# Patient Record
Sex: Female | Born: 1935
Health system: Southern US, Community
[De-identification: ages and names within clinical notes are randomized; demographics above are authoritative.]

## PROBLEM LIST (undated history)

## (undated) DIAGNOSIS — N183 Chronic kidney disease, stage 3 unspecified: Secondary | ICD-10-CM

## (undated) DIAGNOSIS — M545 Low back pain, unspecified: Secondary | ICD-10-CM

## (undated) DIAGNOSIS — E559 Vitamin D deficiency, unspecified: Secondary | ICD-10-CM

## (undated) DIAGNOSIS — I2510 Atherosclerotic heart disease of native coronary artery without angina pectoris: Secondary | ICD-10-CM

## (undated) DIAGNOSIS — I6529 Occlusion and stenosis of unspecified carotid artery: Secondary | ICD-10-CM

## (undated) DIAGNOSIS — N3281 Overactive bladder: Secondary | ICD-10-CM

## (undated) DIAGNOSIS — E782 Mixed hyperlipidemia: Secondary | ICD-10-CM

## (undated) DIAGNOSIS — I10 Essential (primary) hypertension: Secondary | ICD-10-CM

## (undated) DIAGNOSIS — M8580 Other specified disorders of bone density and structure, unspecified site: Secondary | ICD-10-CM

## (undated) HISTORY — DX: Chronic kidney disease, stage 3 unspecified: N18.30

## (undated) HISTORY — DX: Vitamin D deficiency, unspecified: E55.9

## (undated) HISTORY — DX: Other specified disorders of bone density and structure, unspecified site: M85.80

## (undated) HISTORY — DX: Chronic kidney disease, stage 3 (moderate): N18.3

## (undated) HISTORY — DX: Essential (primary) hypertension: I10

## (undated) HISTORY — DX: Atherosclerotic heart disease of native coronary artery without angina pectoris: I25.10

## (undated) HISTORY — DX: Overactive bladder: N32.81

## (undated) HISTORY — DX: Low back pain: M54.5

## (undated) HISTORY — DX: Occlusion and stenosis of unspecified carotid artery: I65.29

## (undated) HISTORY — DX: Mixed hyperlipidemia: E78.2

## (undated) HISTORY — DX: Low back pain, unspecified: M54.50

---

## 1974-08-14 HISTORY — PX: AUGMENTATION MAMMAPLASTY: SUR837

## 1999-07-29 ENCOUNTER — Encounter: Admission: RE | Admit: 1999-07-29 | Discharge: 1999-07-29 | Payer: Self-pay | Admitting: Internal Medicine

## 1999-07-29 ENCOUNTER — Encounter: Payer: Self-pay | Admitting: Internal Medicine

## 1999-08-01 ENCOUNTER — Ambulatory Visit (HOSPITAL_COMMUNITY): Admission: RE | Admit: 1999-08-01 | Discharge: 1999-08-02 | Payer: Self-pay | Admitting: *Deleted

## 2000-08-27 ENCOUNTER — Encounter: Payer: Self-pay | Admitting: Internal Medicine

## 2000-08-27 ENCOUNTER — Encounter: Admission: RE | Admit: 2000-08-27 | Discharge: 2000-08-27 | Payer: Self-pay | Admitting: Internal Medicine

## 2000-09-04 ENCOUNTER — Encounter: Payer: Self-pay | Admitting: Internal Medicine

## 2000-09-04 ENCOUNTER — Encounter: Admission: RE | Admit: 2000-09-04 | Discharge: 2000-09-04 | Payer: Self-pay | Admitting: Internal Medicine

## 2002-08-21 ENCOUNTER — Other Ambulatory Visit: Admission: RE | Admit: 2002-08-21 | Discharge: 2002-08-21 | Payer: Self-pay | Admitting: *Deleted

## 2003-07-13 ENCOUNTER — Other Ambulatory Visit: Admission: RE | Admit: 2003-07-13 | Discharge: 2003-07-13 | Payer: Self-pay | Admitting: Internal Medicine

## 2004-01-01 ENCOUNTER — Ambulatory Visit (HOSPITAL_COMMUNITY): Admission: RE | Admit: 2004-01-01 | Discharge: 2004-01-01 | Payer: Self-pay | Admitting: Gastroenterology

## 2004-08-26 ENCOUNTER — Encounter: Admission: RE | Admit: 2004-08-26 | Discharge: 2004-08-26 | Payer: Self-pay | Admitting: Internal Medicine

## 2004-08-29 ENCOUNTER — Other Ambulatory Visit: Admission: RE | Admit: 2004-08-29 | Discharge: 2004-08-29 | Payer: Self-pay | Admitting: Internal Medicine

## 2005-08-22 ENCOUNTER — Other Ambulatory Visit: Admission: RE | Admit: 2005-08-22 | Discharge: 2005-08-22 | Payer: Self-pay | Admitting: Internal Medicine

## 2005-08-25 ENCOUNTER — Encounter: Admission: RE | Admit: 2005-08-25 | Discharge: 2005-08-25 | Payer: Self-pay | Admitting: Internal Medicine

## 2005-10-23 ENCOUNTER — Encounter: Admission: RE | Admit: 2005-10-23 | Discharge: 2005-10-23 | Payer: Self-pay | Admitting: Internal Medicine

## 2006-10-30 ENCOUNTER — Encounter: Admission: RE | Admit: 2006-10-30 | Discharge: 2006-10-30 | Payer: Self-pay | Admitting: Internal Medicine

## 2006-11-16 ENCOUNTER — Encounter: Admission: RE | Admit: 2006-11-16 | Discharge: 2006-11-16 | Payer: Self-pay | Admitting: Interventional Cardiology

## 2006-11-29 ENCOUNTER — Emergency Department (HOSPITAL_COMMUNITY): Admission: EM | Admit: 2006-11-29 | Discharge: 2006-11-29 | Payer: Self-pay | Admitting: Emergency Medicine

## 2006-12-04 ENCOUNTER — Ambulatory Visit: Payer: Self-pay | Admitting: Vascular Surgery

## 2008-01-01 ENCOUNTER — Encounter: Admission: RE | Admit: 2008-01-01 | Discharge: 2008-01-01 | Payer: Self-pay | Admitting: Internal Medicine

## 2009-03-02 ENCOUNTER — Encounter: Admission: RE | Admit: 2009-03-02 | Discharge: 2009-03-02 | Payer: Self-pay | Admitting: Internal Medicine

## 2010-03-09 ENCOUNTER — Encounter: Admission: RE | Admit: 2010-03-09 | Discharge: 2010-03-09 | Payer: Self-pay | Admitting: Internal Medicine

## 2011-12-19 ENCOUNTER — Ambulatory Visit: Payer: Medicare Other | Attending: Internal Medicine

## 2011-12-19 DIAGNOSIS — M545 Low back pain, unspecified: Secondary | ICD-10-CM | POA: Insufficient documentation

## 2011-12-19 DIAGNOSIS — IMO0001 Reserved for inherently not codable concepts without codable children: Secondary | ICD-10-CM | POA: Insufficient documentation

## 2011-12-19 DIAGNOSIS — M25569 Pain in unspecified knee: Secondary | ICD-10-CM | POA: Insufficient documentation

## 2011-12-26 ENCOUNTER — Ambulatory Visit: Payer: Medicare Other | Admitting: Physical Therapy

## 2011-12-28 ENCOUNTER — Ambulatory Visit: Payer: Medicare Other

## 2012-01-02 ENCOUNTER — Ambulatory Visit: Payer: Medicare Other

## 2012-01-05 ENCOUNTER — Ambulatory Visit: Payer: Medicare Other

## 2012-01-09 ENCOUNTER — Ambulatory Visit: Payer: Medicare Other | Admitting: Rehabilitative and Restorative Service Providers"

## 2012-01-12 ENCOUNTER — Ambulatory Visit: Payer: Medicare Other

## 2012-01-16 ENCOUNTER — Ambulatory Visit: Payer: Medicare Other | Attending: Internal Medicine

## 2012-01-16 DIAGNOSIS — M545 Low back pain, unspecified: Secondary | ICD-10-CM | POA: Insufficient documentation

## 2012-01-16 DIAGNOSIS — M25569 Pain in unspecified knee: Secondary | ICD-10-CM | POA: Insufficient documentation

## 2012-01-16 DIAGNOSIS — IMO0001 Reserved for inherently not codable concepts without codable children: Secondary | ICD-10-CM | POA: Insufficient documentation

## 2012-01-18 ENCOUNTER — Ambulatory Visit: Payer: Medicare Other | Admitting: Rehabilitative and Restorative Service Providers"

## 2012-01-25 ENCOUNTER — Ambulatory Visit: Payer: Medicare Other | Admitting: Rehabilitative and Restorative Service Providers"

## 2012-01-30 ENCOUNTER — Ambulatory Visit: Payer: Medicare Other | Admitting: Rehabilitative and Restorative Service Providers"

## 2012-02-06 ENCOUNTER — Encounter: Payer: Medicare Other | Admitting: Physical Therapy

## 2012-02-14 ENCOUNTER — Encounter: Payer: Medicare Other | Admitting: Rehabilitative and Restorative Service Providers"

## 2012-11-19 ENCOUNTER — Other Ambulatory Visit: Payer: Self-pay

## 2012-11-19 DIAGNOSIS — Z9882 Breast implant status: Secondary | ICD-10-CM

## 2012-11-19 DIAGNOSIS — Z1231 Encounter for screening mammogram for malignant neoplasm of breast: Secondary | ICD-10-CM

## 2012-12-20 ENCOUNTER — Ambulatory Visit
Admission: RE | Admit: 2012-12-20 | Discharge: 2012-12-20 | Disposition: A | Discharge status: Home / Self Care | Payer: Medicare Other | Source: Ambulatory Visit

## 2012-12-20 DIAGNOSIS — Z9882 Breast implant status: Secondary | ICD-10-CM

## 2012-12-20 DIAGNOSIS — Z1231 Encounter for screening mammogram for malignant neoplasm of breast: Secondary | ICD-10-CM

## 2013-05-16 ENCOUNTER — Telehealth: Payer: Self-pay | Admitting: Interventional Cardiology

## 2013-05-16 NOTE — Telephone Encounter (Signed)
New Problem  Samples.. Sent to medications line.

## 2013-05-19 ENCOUNTER — Telehealth: Payer: Self-pay | Admitting: *Deleted

## 2013-05-19 NOTE — Telephone Encounter (Signed)
Pt. called requesting Zetia samples. If samples are not available, she would like a refill sent to [facility] on [address]. Thanks, MI

## 2013-05-19 NOTE — Telephone Encounter (Signed)
Patient notified that samples of Zetia left at front desk for pick up.

## 2013-05-19 NOTE — Telephone Encounter (Signed)
New Problem  Pt request Zetia samples// please cal back.

## 2013-07-07 ENCOUNTER — Other Ambulatory Visit: Payer: Self-pay | Admitting: Interventional Cardiology

## 2013-07-24 ENCOUNTER — Telehealth: Payer: Self-pay | Admitting: Interventional Cardiology

## 2013-07-24 MED ORDER — EZETIMIBE 10 MG PO TABS: 10.0000 mg | ORAL_TABLET | Freq: Every day | ORAL | Status: DC

## 2013-07-24 NOTE — Telephone Encounter (Signed)
New message    Want samples of zetia---if we do not have samples, pls call in presc to [facility].  Pt has not seen Dr Seth Bake in new office

## 2013-07-24 NOTE — Telephone Encounter (Signed)
Refilled zetia as we are out of samples.

## 2013-08-04 ENCOUNTER — Other Ambulatory Visit: Payer: Self-pay | Admitting: Interventional Cardiology

## 2013-09-02 ENCOUNTER — Telehealth: Payer: Self-pay | Admitting: Interventional Cardiology

## 2013-09-02 NOTE — Telephone Encounter (Signed)
Ok for [employer]. See last telephone note.

## 2013-09-02 NOTE — Telephone Encounter (Signed)
New message     Want samples of zetia---do we have samples?   Pt has not seen Dr Irish Lack in the new office.

## 2013-09-04 NOTE — Telephone Encounter (Signed)
Samples left at front desk for pick up.  

## 2014-02-24 ENCOUNTER — Encounter: Payer: Self-pay | Admitting: Interventional Cardiology

## 2014-03-05 ENCOUNTER — Other Ambulatory Visit: Payer: Self-pay | Admitting: Cardiology

## 2014-03-05 DIAGNOSIS — I6529 Occlusion and stenosis of unspecified carotid artery: Secondary | ICD-10-CM

## 2014-04-10 ENCOUNTER — Ambulatory Visit: Payer: Medicare Other | Admitting: Interventional Cardiology

## 2014-04-14 ENCOUNTER — Other Ambulatory Visit: Payer: Self-pay

## 2014-04-14 ENCOUNTER — Telehealth: Payer: Self-pay | Admitting: Interventional Cardiology

## 2014-04-14 MED ORDER — AMLODIPINE BESYLATE 5 MG PO TABS: ORAL_TABLET | ORAL | Status: DC

## 2014-04-14 NOTE — Telephone Encounter (Signed)
New message     Refill atenolol and hctz---have not seen Dr Irish Lack at new office.  [email or web address]

## 2014-04-15 MED ORDER — ATENOLOL 100 MG PO TABS: 100.0000 mg | ORAL_TABLET | Freq: Every day | ORAL | Status: DC

## 2014-04-15 MED ORDER — HYDROCHLOROTHIAZIDE 25 MG PO TABS: 25.0000 mg | ORAL_TABLET | Freq: Every day | ORAL | Status: DC

## 2014-04-15 NOTE — Telephone Encounter (Signed)
Refilled

## 2014-05-04 ENCOUNTER — Encounter: Payer: Self-pay | Admitting: Cardiology

## 2014-05-04 DIAGNOSIS — I2510 Atherosclerotic heart disease of native coronary artery without angina pectoris: Secondary | ICD-10-CM | POA: Insufficient documentation

## 2014-05-04 DIAGNOSIS — I6529 Occlusion and stenosis of unspecified carotid artery: Secondary | ICD-10-CM

## 2014-05-04 DIAGNOSIS — I779 Disorder of arteries and arterioles, unspecified: Secondary | ICD-10-CM | POA: Insufficient documentation

## 2014-05-04 DIAGNOSIS — E782 Mixed hyperlipidemia: Secondary | ICD-10-CM

## 2014-05-04 DIAGNOSIS — I739 Peripheral vascular disease, unspecified: Secondary | ICD-10-CM

## 2014-05-04 DIAGNOSIS — I10 Essential (primary) hypertension: Secondary | ICD-10-CM

## 2014-05-05 ENCOUNTER — Encounter (HOSPITAL_COMMUNITY): Payer: Medicare Other

## 2014-05-05 ENCOUNTER — Encounter: Payer: Self-pay | Admitting: Interventional Cardiology

## 2014-05-05 ENCOUNTER — Encounter: Payer: Self-pay | Admitting: Cardiology

## 2014-05-05 ENCOUNTER — Ambulatory Visit (INDEPENDENT_AMBULATORY_CARE_PROVIDER_SITE_OTHER): Payer: Commercial Managed Care - HMO | Admitting: Interventional Cardiology

## 2014-05-05 ENCOUNTER — Ambulatory Visit (HOSPITAL_COMMUNITY): Payer: Medicare HMO | Attending: Interventional Cardiology | Admitting: Cardiology

## 2014-05-05 VITALS — BP 110/70 | HR 51 | Ht 63.5 in | Wt 189.0 lb

## 2014-05-05 DIAGNOSIS — Z7901 Long term (current) use of anticoagulants: Secondary | ICD-10-CM | POA: Insufficient documentation

## 2014-05-05 DIAGNOSIS — Z7982 Long term (current) use of aspirin: Secondary | ICD-10-CM | POA: Insufficient documentation

## 2014-05-05 DIAGNOSIS — I6529 Occlusion and stenosis of unspecified carotid artery: Secondary | ICD-10-CM

## 2014-05-05 DIAGNOSIS — E785 Hyperlipidemia, unspecified: Secondary | ICD-10-CM | POA: Diagnosis not present

## 2014-05-05 DIAGNOSIS — I498 Other specified cardiac arrhythmias: Secondary | ICD-10-CM | POA: Insufficient documentation

## 2014-05-05 DIAGNOSIS — I129 Hypertensive chronic kidney disease with stage 1 through stage 4 chronic kidney disease, or unspecified chronic kidney disease: Secondary | ICD-10-CM | POA: Insufficient documentation

## 2014-05-05 DIAGNOSIS — N183 Chronic kidney disease, stage 3 unspecified: Secondary | ICD-10-CM | POA: Diagnosis not present

## 2014-05-05 DIAGNOSIS — E782 Mixed hyperlipidemia: Secondary | ICD-10-CM

## 2014-05-05 DIAGNOSIS — I2510 Atherosclerotic heart disease of native coronary artery without angina pectoris: Secondary | ICD-10-CM | POA: Insufficient documentation

## 2014-05-05 DIAGNOSIS — Z8249 Family history of ischemic heart disease and other diseases of the circulatory system: Secondary | ICD-10-CM | POA: Insufficient documentation

## 2014-05-05 DIAGNOSIS — E7800 Pure hypercholesterolemia, unspecified: Secondary | ICD-10-CM | POA: Insufficient documentation

## 2014-05-05 DIAGNOSIS — I10 Essential (primary) hypertension: Secondary | ICD-10-CM

## 2014-05-05 MED ORDER — EZETIMIBE 10 MG PO TABS: 10.0000 mg | ORAL_TABLET | Freq: Every day | ORAL | Status: DC

## 2014-05-05 NOTE — Patient Instructions (Addendum)
Your physician has recommended you make the following change in your medication:   1. Restart Zetia 10 mg 1 tablet daily.  Your physician recommends that you return for a FASTING lipid and heaptic panel on 08/04/14.  Your physician wants you to follow-up in: 1 year with Dr. Irish Lack. You will receive a reminder letter in the mail two months in advance. If you don't receive a letter, please call our office to schedule the follow-up appointment.

## 2014-05-05 NOTE — Progress Notes (Signed)
Patient ID: Lisa Petty, female   DOB: [birth year], 78 y.o.   MRN: [id number]    [place], [place], [place]  [address] Phone: [phone] Fax:  [phone]  Date:  05/05/2014   ID:  Lisa, Petty Jul 05, 1936, MRN [id number]  PCP:  Horton Finer, MD      History of Present Illness: Lisa Petty is a 78 y.o. female who has an occluded carotid artery. She has been doing well. SHe has not been having any neuro sx. Exercise limited by back and knee pain. She has not been exercising at the [facility]. BP has not been checked at the drug store. Son had heart transplant, almost 2 years ago. CAD/ASCVD:  c/o Dyspnea on exertion not as severe as she had prior to stent; minimal.  c/o Leg edema more at the end of the day.  Denies : Chest pain.  Diaphoresis.  Dizziness.  Nitroglycerin.  Orthopnea.  Palpitations.  Paroxysmal nocturnal dyspnea.  Syncope.     Wt Readings from Last 3 Encounters:  05/05/14 189 lb (85.73 kg)     Past Medical History  Diagnosis Date  . Coronary atherosclerosis of native coronary artery     s/p stent to LAD 2000-no MI, right ICA 100% stenosis;left side followed by Dr. Irish Lack  . Essential hypertension, benign   . Occlusion and stenosis of carotid artery without mention of cerebral infarction   . Mixed hyperlipidemia   . Osteopenia     mild to normal (BMD 08/18/09 normal)  . CKD (chronic kidney disease), stage III   . Low back pain   . Overactive bladder     on detrol   . Vitamin D deficiency     repleted by ASCUS    Current Outpatient Prescriptions  Medication Sig Dispense Refill  . amLODipine (NORVASC) 5 MG tablet take 1 tablet by mouth once daily for blood pressure  30 tablet  1  . aspirin 81 MG tablet Take 81 mg by mouth daily.      Marland Kitchen atenolol (TENORMIN) 100 MG tablet Take 1 tablet (100 mg total) by mouth daily.  30 tablet  0  . Calcium Carbonate-Vitamin D (RA CALCIUM PLUS VITAMIN D) 600-400 MG-UNIT per tablet Take 1 tablet  by mouth 2 (two) times daily.      . clopidogrel (PLAVIX) 75 MG tablet take 1 tablet by mouth once daily  30 tablet  10  . diltiazem (CARDIZEM CD) 240 MG 24 hr capsule Take 240 mg by mouth daily.      Marland Kitchen diltiazem (DILACOR XR) 240 MG 24 hr capsule Take 240 mg by mouth daily.      . DUREZOL 0.05 % EMUL Place into both eyes daily.      Marland Kitchen gentamicin (GARAMYCIN) 0.3 % ophthalmic solution       . hydrochlorothiazide (HYDRODIURIL) 25 MG tablet Take 1 tablet (25 mg total) by mouth daily.  30 tablet  0  . ILEVRO 0.3 % SUSP       . LORazepam (ATIVAN) 0.5 MG tablet Take 0.5 mg by mouth 2 (two) times daily as needed for anxiety.      Marland Kitchen losartan (COZAAR) 50 MG tablet Take 50 mg by mouth daily.      . nitroGLYCERIN (NITROSTAT) 0.4 MG SL tablet Place 0.4 mg under the tongue every 5 (five) minutes as needed for chest pain.      Marland Kitchen PROLENSA 0.07 % SOLN       .  rosuvastatin (CRESTOR) 40 MG tablet Take 40 mg by mouth daily.      Marland Kitchen tolterodine (DETROL LA) 4 MG 24 hr capsule Take 4 mg by mouth daily.      . Vitamin D, Ergocalciferol, (DRISDOL) 50000 UNITS CAPS capsule Take 50,000 Units by mouth daily.      . vitamin E 400 UNIT capsule Take 400 Units by mouth daily.      Marland Kitchen ezetimibe (ZETIA) 10 MG tablet Take 1 tablet (10 mg total) by mouth daily.  30 tablet  5   No current facility-administered medications for this visit.    Allergies:   No Known Allergies  Social History:  The patient  reports that she has never smoked. She does not have any smokeless tobacco history on file. She reports that she does not drink alcohol.   Family History:  The patient's family history includes CAD in her brother, father, and son; Dementia in her mother; Diabetes in her sister; Lung cancer in her father.   ROS:  Please see the history of present illness.  No nausea, vomiting.  No fevers, chills.  No focal weakness.  No dysuria. Knee pain.   All other systems reviewed and negative.   PHYSICAL EXAM: VS:  BP 110/70  Pulse 51   Ht 5' 3.5" (1.613 m)  Wt 189 lb (85.73 kg)  BMI 32.95 kg/m2 Well nourished, well developed, in no acute distress HEENT: normal Neck: no JVD, Cardiac:  normal S1, S2; RRR;  Lungs:  clear to auscultation bilaterally, no wheezing, rhonchi or rales Abd: soft, nontender, no hepatomegaly Ext: no edema Skin: warm and dry Neuro:   no focal abnormalities noted  EKG:  Sinus bradycardia, septal Q waves   ASSESSMENT AND PLAN:  Coronary atherosclerosis of native coronary artery  Continue Plavix Tablet, 75 MG, take 1 tablet by mouth once daily Continue Aspirin Tablet Chewable, 81 MG, 1 tablet, Orally, Once a day IMAGING: EKG    Overton,Shana 04/11/2013 10:42:36 AM > Shonte Beutler,JAY 04/11/2013 11:04:03 AM > NSR, IRBBB, no significant ST segment changes   Notes: No angina. Negative stress test in 8/11. Prior LAD stent   2. Essential hypertension, benign  Notes: Check BP outside of the doctor's office. If consistently > 140/90, then let us know and would increase BP meds.  Continue Diltiazem and amlodipine since  Cannot increase dilt due to sloe heart rate.   Better today. Minimize salt.   Would try for target < 140/90 due to known CAD/vascular disease   3. Carotid Artery Disease  Notes: Continue regular Duplex checks. Known occluded right ICA.    4. Hypercholesterolemia, Mixed  Restart Zetia Tablet, 10 MG, take 1 tablet by mouth once daily, Orally, Once a day Continue Crestor Tablet, 40 MG, 1 tablet, Orally, Once a day Notes: LDL 79 in 2014. Agree with crestor. LDL 114 off of Zetia.  Restart Zetia for target LDL < 100.  Lipids in 3 months.    Preventive Medicine  Adult topics discussed:  Diet: healthy diet, low calorie, low fat, low salt.  Exercise: 5 days a week, at least 30 minutes of aerobic exercise.      Signed, Mina Marble, MD, [facility] 05/05/2014 11:46 AM

## 2014-05-05 NOTE — Progress Notes (Signed)
Carotid duplex performed 

## 2014-05-06 ENCOUNTER — Other Ambulatory Visit: Payer: Self-pay | Admitting: Cardiology

## 2014-05-06 DIAGNOSIS — I6529 Occlusion and stenosis of unspecified carotid artery: Secondary | ICD-10-CM

## 2014-05-15 ENCOUNTER — Other Ambulatory Visit: Payer: Self-pay | Admitting: *Deleted

## 2014-05-15 MED ORDER — HYDROCHLOROTHIAZIDE 25 MG PO TABS: 25.0000 mg | ORAL_TABLET | Freq: Every day | ORAL | Status: DC

## 2014-05-15 MED ORDER — ATENOLOL 100 MG PO TABS: 100.0000 mg | ORAL_TABLET | Freq: Every day | ORAL | Status: DC

## 2014-06-16 ENCOUNTER — Telehealth: Payer: Self-pay

## 2014-06-16 ENCOUNTER — Other Ambulatory Visit: Payer: Self-pay

## 2014-06-16 MED ORDER — AMLODIPINE BESYLATE 5 MG PO TABS: ORAL_TABLET | ORAL | Status: DC

## 2014-06-16 NOTE — Telephone Encounter (Signed)
Patient called to get samples of zetia placed a month samples at front desk

## 2014-07-21 ENCOUNTER — Other Ambulatory Visit: Payer: Self-pay | Admitting: *Deleted

## 2014-07-21 MED ORDER — CLOPIDOGREL BISULFATE 75 MG PO TABS: 75.0000 mg | ORAL_TABLET | Freq: Every day | ORAL | Status: DC

## 2014-07-29 ENCOUNTER — Telehealth: Payer: Self-pay | Admitting: Interventional Cardiology

## 2014-07-29 NOTE — Telephone Encounter (Signed)
Received request from Nurse fax box, documents faxed for surgical clearance. To: Lisa Petty  Fax number: [phone] Attention: [id number]/KM

## 2014-08-04 ENCOUNTER — Other Ambulatory Visit (INDEPENDENT_AMBULATORY_CARE_PROVIDER_SITE_OTHER): Payer: Commercial Managed Care - HMO | Admitting: *Deleted

## 2014-08-04 DIAGNOSIS — E782 Mixed hyperlipidemia: Secondary | ICD-10-CM

## 2014-08-04 LAB — HEPATIC FUNCTION PANEL
ALK PHOS: 61 U/L (ref 39–117)
ALT: 19 U/L (ref 0–35)
AST: 22 U/L (ref 0–37)
Albumin: 4.2 g/dL (ref 3.5–5.2)
BILIRUBIN TOTAL: 0.7 mg/dL (ref 0.2–1.2)
Bilirubin, Direct: 0 mg/dL (ref 0.0–0.3)
Total Protein: 7.5 g/dL (ref 6.0–8.3)

## 2014-08-04 LAB — LIPID PANEL
CHOLESTEROL: 143 mg/dL (ref 0–200)
HDL: 47.2 mg/dL (ref >39.00)
LDL Cholesterol: 72 mg/dL (ref 0–99)
NonHDL: 95.8
Total CHOL/HDL Ratio: 3
Triglycerides: 118 mg/dL (ref 0.0–149.0)
VLDL: 23.6 mg/dL (ref 0.0–40.0)

## 2014-09-02 ENCOUNTER — Telehealth: Payer: Self-pay | Admitting: *Deleted

## 2014-09-02 NOTE — Telephone Encounter (Signed)
Zetia samples placed at the front desk for patient. 

## 2014-11-13 ENCOUNTER — Telehealth: Payer: Self-pay

## 2014-11-13 NOTE — Telephone Encounter (Signed)
Patient called for samples of zetia placed samples up front 

## 2014-11-16 ENCOUNTER — Other Ambulatory Visit: Payer: Self-pay | Admitting: Internal Medicine

## 2014-11-16 DIAGNOSIS — Z1231 Encounter for screening mammogram for malignant neoplasm of breast: Secondary | ICD-10-CM

## 2015-01-15 ENCOUNTER — Ambulatory Visit
Admission: RE | Admit: 2015-01-15 | Discharge: 2015-01-15 | Disposition: A | Discharge status: Home / Self Care | Payer: Commercial Managed Care - HMO | Source: Ambulatory Visit | Attending: Internal Medicine | Admitting: Internal Medicine

## 2015-01-15 DIAGNOSIS — Z1231 Encounter for screening mammogram for malignant neoplasm of breast: Secondary | ICD-10-CM

## 2015-01-19 ENCOUNTER — Other Ambulatory Visit: Payer: Self-pay | Admitting: Internal Medicine

## 2015-01-19 DIAGNOSIS — R928 Other abnormal and inconclusive findings on diagnostic imaging of breast: Secondary | ICD-10-CM

## 2015-01-22 ENCOUNTER — Ambulatory Visit
Admission: RE | Admit: 2015-01-22 | Discharge: 2015-01-22 | Disposition: A | Discharge status: Home / Self Care | Payer: Commercial Managed Care - HMO | Source: Ambulatory Visit | Attending: Internal Medicine | Admitting: Internal Medicine

## 2015-01-22 DIAGNOSIS — R928 Other abnormal and inconclusive findings on diagnostic imaging of breast: Secondary | ICD-10-CM

## 2015-01-30 ENCOUNTER — Other Ambulatory Visit: Payer: Self-pay | Admitting: Interventional Cardiology

## 2015-05-07 ENCOUNTER — Encounter (HOSPITAL_COMMUNITY): Payer: Commercial Managed Care - HMO

## 2015-05-12 ENCOUNTER — Other Ambulatory Visit: Payer: Self-pay | Admitting: Interventional Cardiology

## 2015-05-12 DIAGNOSIS — I6523 Occlusion and stenosis of bilateral carotid arteries: Secondary | ICD-10-CM

## 2015-05-15 ENCOUNTER — Other Ambulatory Visit: Payer: Self-pay | Admitting: Interventional Cardiology

## 2015-05-18 ENCOUNTER — Ambulatory Visit (HOSPITAL_COMMUNITY)
Admission: RE | Admit: 2015-05-18 | Discharge: 2015-05-18 | Disposition: A | Discharge status: Home / Self Care | Payer: Commercial Managed Care - HMO | Source: Ambulatory Visit | Attending: Urology | Admitting: Urology

## 2015-05-18 DIAGNOSIS — N183 Chronic kidney disease, stage 3 (moderate): Secondary | ICD-10-CM | POA: Diagnosis not present

## 2015-05-18 DIAGNOSIS — I129 Hypertensive chronic kidney disease with stage 1 through stage 4 chronic kidney disease, or unspecified chronic kidney disease: Secondary | ICD-10-CM | POA: Insufficient documentation

## 2015-05-18 DIAGNOSIS — E782 Mixed hyperlipidemia: Secondary | ICD-10-CM | POA: Insufficient documentation

## 2015-05-18 DIAGNOSIS — I6523 Occlusion and stenosis of bilateral carotid arteries: Secondary | ICD-10-CM

## 2015-05-27 ENCOUNTER — Encounter: Payer: Self-pay | Admitting: Interventional Cardiology

## 2015-05-27 ENCOUNTER — Ambulatory Visit (INDEPENDENT_AMBULATORY_CARE_PROVIDER_SITE_OTHER): Payer: Commercial Managed Care - HMO | Admitting: Interventional Cardiology

## 2015-05-27 VITALS — BP 140/62 | HR 54 | Ht 63.5 in | Wt 191.4 lb

## 2015-05-27 DIAGNOSIS — I779 Disorder of arteries and arterioles, unspecified: Secondary | ICD-10-CM | POA: Diagnosis not present

## 2015-05-27 DIAGNOSIS — E782 Mixed hyperlipidemia: Secondary | ICD-10-CM | POA: Diagnosis not present

## 2015-05-27 DIAGNOSIS — I10 Essential (primary) hypertension: Secondary | ICD-10-CM | POA: Diagnosis not present

## 2015-05-27 DIAGNOSIS — I2510 Atherosclerotic heart disease of native coronary artery without angina pectoris: Secondary | ICD-10-CM | POA: Diagnosis not present

## 2015-05-27 DIAGNOSIS — I739 Peripheral vascular disease, unspecified: Secondary | ICD-10-CM

## 2015-05-27 NOTE — Progress Notes (Signed)
Patient ID: Lisa Petty, female   DOB: [birth year], 80 y.o.   MRN: [id number]     Cardiology Office Note   Date:  05/27/2015   ID:  Lisa Petty, DOB 05/05/1936, MRN [id number]  PCP:  Lottie Dawson, MD    Chief Complaint  Patient presents with  . Follow-up    ANNUAL  coronary, carotid artery disease   Wt Readings from Last 3 Encounters:  05/27/15 191 lb 6.4 oz (86.818 kg)  05/05/14 189 lb (85.73 kg)       History of Present Illness: Lisa Petty is a 79 y.o. female  who has an occluded carotid artery. She has been doing well. SHe has not been having any neuro sx. Exercise limited by back and knee pain. She has not been exercising at the [facility]. BP has not been checked at the drug store. Son had heart transplant, in 2013. CAD/ASCVD:  c/o Dyspnea on exertion not as severe as she had prior to stent; minimal.  c/o Leg edema more at the end of the day.  Denies : Chest pain.  Diaphoresis.  Dizziness.  Nitroglycerin.  Orthopnea.  Palpitations.  Paroxysmal nocturnal dyspnea.  Syncope.    Overall, she is feeling well.  SHe is following up with u/s for her carotid disease.   Past Medical History  Diagnosis Date  . Coronary atherosclerosis of native coronary artery     s/p stent to LAD 2000-no MI, right ICA 100% stenosis;left side followed by Dr. Irish Lack  . Essential hypertension, benign   . Occlusion and stenosis of carotid artery without mention of cerebral infarction   . Mixed hyperlipidemia   . Osteopenia     mild to normal (BMD 08/18/09 normal)  . CKD (chronic kidney disease), stage III   . Low back pain   . Overactive bladder     on detrol   . Vitamin D deficiency     repleted by ASCUS    No past surgical history on file.   Current Outpatient Prescriptions  Medication Sig Dispense Refill  . amLODipine (NORVASC) 5 MG tablet TAKE ONE TABLET BY MOUTH ONCE DAILY FOR BLOOD PRESSURE 30 tablet 3  . aspirin 81 MG tablet Take 81 mg by mouth daily.    Marland Kitchen  atenolol (TENORMIN) 100 MG tablet Take 1 tablet (100 mg total) by mouth daily. 30 tablet 11  . atorvastatin (LIPITOR) 80 MG tablet Take 80 mg by mouth at bedtime.    . Calcium Carbonate-Vitamin D (RA CALCIUM PLUS VITAMIN D) 600-400 MG-UNIT per tablet Take 1 tablet by mouth 2 (two) times daily.    . clopidogrel (PLAVIX) 75 MG tablet Take 1 tablet (75 mg total) by mouth daily. 30 tablet 10  . diltiazem (CARDIZEM CD) 240 MG 24 hr capsule Take 240 mg by mouth daily.    . DUREZOL 0.05 % EMUL Place into both eyes daily.    Marland Kitchen gentamicin (GARAMYCIN) 0.3 % ophthalmic solution     . hydrochlorothiazide (HYDRODIURIL) 25 MG tablet Take 1 tablet (25 mg total) by mouth daily. 30 tablet 11  . ILEVRO 0.3 % SUSP     . LORazepam (ATIVAN) 0.5 MG tablet Take 0.5 mg by mouth 2 (two) times daily as needed for anxiety.    Marland Kitchen losartan (COZAAR) 50 MG tablet Take 50 mg by mouth daily.    . nitroGLYCERIN (NITROSTAT) 0.4 MG SL tablet Place 0.4 mg under the tongue every 5 (five) minutes as needed for chest pain.    Marland Kitchen  PROLENSA 0.07 % SOLN     . tolterodine (DETROL LA) 4 MG 24 hr capsule Take 4 mg by mouth daily.    . traMADol (ULTRAM) 50 MG tablet Take 50 mg by mouth every 6 (six) hours as needed. (PAIN)    . Vitamin D, Ergocalciferol, (DRISDOL) 50000 UNITS CAPS capsule Take 50,000 Units by mouth daily.    . vitamin E 400 UNIT capsule Take 400 Units by mouth daily.    Marland Kitchen ZETIA 10 MG tablet TAKE ONE TABLET BY MOUTH ONCE DAILY 30 tablet 0   No current facility-administered medications for this visit.    Allergies:   Review of patient's allergies indicates no known allergies.    Social History:  The patient  reports that she has never smoked. She has never used smokeless tobacco. She reports that she does not drink alcohol.   Family History:  The patient's family history includes CAD in her brother, father, and son; Dementia in her mother; Diabetes in her sister; Lung cancer in her father.    ROS:  Please see the history  of present illness.   Otherwise, review of systems are positive for joint pain.   All other systems are reviewed and negative.    PHYSICAL EXAM: VS:  BP 140/62 mmHg  Pulse 54  Ht 5' 3.5" (1.613 m)  Wt 191 lb 6.4 oz (86.818 kg)  BMI 33.37 kg/m2 , BMI Body mass index is 33.37 kg/(m^2). GEN: Well nourished, well developed, in no acute distress HEENT: normal Neck: no JVD, right carotid bruit, or masses Cardiac: RRR;  murmurs, rubs, or gallops,; mild ankle  edema  Respiratory:  clear to auscultation bilaterally, normal work of breathing GI: soft, nontender, nondistended, + BS MS: no deformity or atrophy Skin: warm and dry, no rash Neuro:  Strength and sensation are intact Psych: euthymic mood, full affect   EKG:   The ekg ordered today demonstrates sinus bradycardia   Recent Labs: 08/04/2014: ALT 19   Lipid Panel    Component Value Date/Time   CHOL 143 08/04/2014 0900   TRIG 118.0 08/04/2014 0900   HDL 47.20 08/04/2014 0900   CHOLHDL 3 08/04/2014 0900   VLDL 23.6 08/04/2014 0900   LDLCALC 72 08/04/2014 0900     Other studies Reviewed: Additional studies/ records that were reviewed today with results demonstrating: carotid Doppler from 10/16.   ASSESSMENT AND PLAN:  1. CAD: No angina. Consider stopping aspirin. Would continue clopidogrel.  Continue aggressive secondary prevention. 2. Hypertension:  Fairly well controlled. Continue current medications.  Would not increase any rate slowing medicines. She is bradycardic at this point. If the bradycardia becomes a problem, could back off on the Cardizem and increase the amlodipine. 3.  Carotid disease: Continue regular ultrasounds. Hypercholesterolemia: Continue atorvastatin and 70. She is tolerating atorvastatin at this point. She would consider switching to generic Crestor if it is less expensive. She will check with her pharmacist.  She neds lipids checked in December 2016.  OK to do with PMD at her yearly physical.     Current medicines are reviewed at length with the patient today.  The patient concerns regarding her medicines were addressed.  The following changes have been made:  No change  Labs/ tests ordered today include:  No orders of the defined types were placed in this encounter.    Recommend 150 minutes/week of aerobic exercise Low fat, low carb, high fiber diet recommended  Disposition:   FU in 1 year   Signed, Hondo Nanda  S., MD  05/27/2015 4:38 PM    [facility] [place], [place], [place]  [address] Phone: [phone]; Fax: [phone]

## 2015-05-27 NOTE — Patient Instructions (Signed)
**Note De-Identified Lisa Petty Obfuscation** Medication Instructions:  Same-no changes  Labwork: CMET and Lipids when you see your Primary care doctor in December. Please asked them to fax results to [phone]  Testing/Procedures: None  Follow-Up: Your physician wants you to follow-up in: 1 year. You will receive a reminder letter in the mail two months in advance. If you don't receive a letter, please call our office to schedule the follow-up appointment.

## 2015-06-02 ENCOUNTER — Other Ambulatory Visit: Payer: Self-pay | Admitting: Interventional Cardiology

## 2015-06-21 ENCOUNTER — Other Ambulatory Visit: Payer: Self-pay | Admitting: Interventional Cardiology

## 2015-07-05 ENCOUNTER — Telehealth: Payer: Self-pay | Admitting: Interventional Cardiology

## 2015-07-05 NOTE — Telephone Encounter (Signed)
New message      Request for surgical clearance:  1. What type of surgery is being performed? Back injection--epidural steroid injection  2. When is this surgery scheduled? Pending clearance  Are there any medications that need to be held prior to surgery and how long?  Hold plavix and aspirin 5-7 days prior 3. Name of physician performing surgery?  Dr Jacelyn Grip  4. What is your office phone and fax number? fax [phone]

## 2015-07-05 NOTE — Telephone Encounter (Signed)
The pt is taking Plavix 75 mg daily and Asa 81 mg daily. Please advise.

## 2015-07-06 NOTE — Telephone Encounter (Signed)
OK to hold aspirin and Plavix 5-7 days prior to injection.

## 2015-07-06 NOTE — Telephone Encounter (Signed)
This message has been manually faxed to Dr Jodi Mourning office at [phone] attn: Franklyn Lor. I did receive conformation that the fax went successfully.

## 2015-07-12 ENCOUNTER — Other Ambulatory Visit: Payer: Self-pay | Admitting: Interventional Cardiology

## 2015-08-30 DIAGNOSIS — M5416 Radiculopathy, lumbar region: Secondary | ICD-10-CM | POA: Diagnosis not present

## 2015-09-14 DIAGNOSIS — M5416 Radiculopathy, lumbar region: Secondary | ICD-10-CM | POA: Diagnosis not present

## 2015-09-29 DIAGNOSIS — M5416 Radiculopathy, lumbar region: Secondary | ICD-10-CM | POA: Diagnosis not present

## 2015-10-15 DIAGNOSIS — H04123 Dry eye syndrome of bilateral lacrimal glands: Secondary | ICD-10-CM | POA: Diagnosis not present

## 2015-10-15 DIAGNOSIS — H40033 Anatomical narrow angle, bilateral: Secondary | ICD-10-CM | POA: Diagnosis not present

## 2015-10-27 DIAGNOSIS — M5416 Radiculopathy, lumbar region: Secondary | ICD-10-CM | POA: Diagnosis not present

## 2015-11-17 DIAGNOSIS — I779 Disorder of arteries and arterioles, unspecified: Secondary | ICD-10-CM | POA: Diagnosis not present

## 2015-11-17 DIAGNOSIS — F411 Generalized anxiety disorder: Secondary | ICD-10-CM | POA: Diagnosis not present

## 2015-11-17 DIAGNOSIS — N183 Chronic kidney disease, stage 3 (moderate): Secondary | ICD-10-CM | POA: Diagnosis not present

## 2015-11-17 DIAGNOSIS — Z0000 Encounter for general adult medical examination without abnormal findings: Secondary | ICD-10-CM | POA: Diagnosis not present

## 2015-11-17 DIAGNOSIS — Z1389 Encounter for screening for other disorder: Secondary | ICD-10-CM | POA: Diagnosis not present

## 2015-11-17 DIAGNOSIS — E785 Hyperlipidemia, unspecified: Secondary | ICD-10-CM | POA: Diagnosis not present

## 2015-11-17 DIAGNOSIS — I2510 Atherosclerotic heart disease of native coronary artery without angina pectoris: Secondary | ICD-10-CM | POA: Diagnosis not present

## 2015-11-17 DIAGNOSIS — I10 Essential (primary) hypertension: Secondary | ICD-10-CM | POA: Diagnosis not present

## 2015-11-17 DIAGNOSIS — Z6834 Body mass index (BMI) 34.0-34.9, adult: Secondary | ICD-10-CM | POA: Diagnosis not present

## 2016-01-11 DIAGNOSIS — H578 Other specified disorders of eye and adnexa: Secondary | ICD-10-CM | POA: Diagnosis not present

## 2016-03-03 DIAGNOSIS — R300 Dysuria: Secondary | ICD-10-CM | POA: Diagnosis not present

## 2016-05-19 ENCOUNTER — Other Ambulatory Visit: Payer: Self-pay | Admitting: Interventional Cardiology

## 2016-05-19 DIAGNOSIS — I6523 Occlusion and stenosis of bilateral carotid arteries: Secondary | ICD-10-CM

## 2016-05-25 NOTE — Progress Notes (Signed)
Patient ID: Lisa Petty, female   DOB: [birth year], 80 y.o.   MRN: [id number]     Cardiology Office Note   Date:  05/26/2016   ID:  Lisa Petty, DOB October 01, 1935, MRN [id number]  PCP:  Ileana Roup, MD    No chief complaint on file. coronary, carotid artery disease   Wt Readings from Last 3 Encounters:  05/26/16 190 lb (86.2 kg)  05/27/15 191 lb 6.4 oz (86.8 kg)  05/05/14 189 lb (85.7 kg)       History of Present Illness: Lisa Petty is a 80 y.o. female  who has an occluded carotid artery. She has been doing well. SHe has not been having any neuro sx. Exercise limited by back and knee pain. She has not been exercising at the [facility], limited by joint pains. BP has been checked at the drug store- readings outside of the MDs ofice have been in the 118-140 range typically. Son had heart transplant, in 2013. CAD/ASCVD:  c/o Dyspnea on exertion not as severe as she had prior to stent; minimal, only with walking up steps  c/o Leg edema more at the end of the day.  Denies : Chest pain. Diaphoresis. Dizziness. Nitroglycerin. Orthopnea. Palpitations. Paroxysmal nocturnal dyspnea. Syncope.    Overall, she is feeling well.  SHe is following up with u/s for her carotid disease.  She had a study this AM.  Results are not back at this time.  No bleeding problems.    Past Medical History:  Diagnosis Date  . CKD (chronic kidney disease), stage III   . Coronary atherosclerosis of native coronary artery    s/p stent to LAD 2000-no MI, right ICA 100% stenosis;left side followed by Dr. Irish Lack  . Essential hypertension, benign   . Low back pain   . Mixed hyperlipidemia   . Occlusion and stenosis of carotid artery without mention of cerebral infarction   . Osteopenia    mild to normal (BMD 08/18/09 normal)  . Overactive bladder    on detrol   . Vitamin D deficiency    repleted by ASCUS    History reviewed. No pertinent surgical history.   Current Outpatient Prescriptions    Medication Sig Dispense Refill  . amLODipine (NORVASC) 5 MG tablet TAKE ONE TABLET BY MOUTH ONCE DAILY FOR BLOOD PRESSURE 30 tablet 11  . aspirin 81 MG tablet Take 81 mg by mouth daily.    Marland Kitchen atenolol (TENORMIN) 100 MG tablet TAKE ONE TABLET BY MOUTH ONCE DAILY 30 tablet 11  . atorvastatin (LIPITOR) 80 MG tablet Take 80 mg by mouth at bedtime.    . Calcium Carbonate-Vitamin D (RA CALCIUM PLUS VITAMIN D) 600-400 MG-UNIT per tablet Take 1 tablet by mouth 2 (two) times daily.    . clopidogrel (PLAVIX) 75 MG tablet TAKE ONE TABLET BY MOUTH ONCE DAILY 30 tablet 10  . diltiazem (CARDIZEM CD) 240 MG 24 hr capsule Take 240 mg by mouth daily.    . DUREZOL 0.05 % EMUL Place into both eyes daily.    Marland Kitchen gentamicin (GARAMYCIN) 0.3 % ophthalmic solution     . hydrochlorothiazide (HYDRODIURIL) 25 MG tablet TAKE ONE TABLET BY MOUTH ONCE DAILY 30 tablet 11  . ILEVRO 0.3 % SUSP     . LORazepam (ATIVAN) 0.5 MG tablet Take 0.5 mg by mouth 2 (two) times daily as needed for anxiety.    Marland Kitchen losartan (COZAAR) 50 MG tablet Take 50 mg by mouth daily.    . nitroGLYCERIN (  NITROSTAT) 0.4 MG SL tablet Place 0.4 mg under the tongue every 5 (five) minutes as needed for chest pain.    Marland Kitchen PROLENSA 0.07 % SOLN     . tolterodine (DETROL LA) 4 MG 24 hr capsule Take 4 mg by mouth daily.    . traMADol (ULTRAM) 50 MG tablet Take 50 mg by mouth every 6 (six) hours as needed. (PAIN)    . vitamin A 10000 UNIT capsule Take 10,000 Units by mouth daily.    . vitamin E 400 UNIT capsule Take 400 Units by mouth daily.    Marland Kitchen ZETIA 10 MG tablet TAKE ONE TABLET BY MOUTH ONCE DAILY 30 tablet 10   No current facility-administered medications for this visit.     Allergies:   Review of patient's allergies indicates no known allergies.    Social History:  The patient  reports that she has never smoked. She has never used smokeless tobacco. She reports that she does not drink alcohol.   Family History:  The patient's family history includes CAD in  her brother, father, and son; Dementia in her mother; Diabetes in her sister; Heart attack in her brother, father, and son; Lung cancer in her father.    ROS:  Please see the history of present illness.   Otherwise, review of systems are positive for joint pain.   All other systems are reviewed and negative.    PHYSICAL EXAM: VS:  BP 140/60   Pulse (!) 54   Ht 5' 3.5" (1.613 m)   Wt 190 lb (86.2 kg)   BMI 33.13 kg/m  , BMI Body mass index is 33.13 kg/m. GEN: Well nourished, well developed, in no acute distress  HEENT: normal  Neck: no JVD, right carotid bruit, or masses Cardiac: RRR;  murmurs, rubs, or gallops,; mild ankle  edema  Respiratory:  clear to auscultation bilaterally, normal work of breathing GI: soft, nontender, nondistended, + BS MS: no deformity or atrophy  Skin: warm and dry, no rash Neuro:  Strength and sensation are intact Psych: euthymic mood, full affect   EKG:   The ekg ordered today demonstrates sinus bradycardia   Recent Labs: No results found for requested labs within last 8760 hours.   Lipid Panel    Component Value Date/Time   CHOL 143 08/04/2014 0900   TRIG 118.0 08/04/2014 0900   HDL 47.20 08/04/2014 0900   CHOLHDL 3 08/04/2014 0900   VLDL 23.6 08/04/2014 0900   LDLCALC 72 08/04/2014 0900     Other studies Reviewed: Additional studies/ records that were reviewed today with results demonstrating: carotid Doppler from 10/16. Study pending from today.    ASSESSMENT AND PLAN:   1. CAD: No angina.  Could consider stopping aspirin- we discussed it but she is scared to stop aspirin. Would continue clopidogrel.  Continue aggressive secondary prevention.  She bruises easily.   2.  Hypertension:  Fairly well controlled. Continue current medications.  Would not increase any rate slowing medicines. She is bradycardic at this point. If the bradycardia becomes a problem, could back off on the Cardizem and increase the amlodipine. 3.  Carotid disease:  Continue regular ultrasounds. Hypercholesterolemia: Continue atorvastatin and Zetia. She is tolerating atorvastatin at this point. She would consider switching to generic Crestor if it is less expensive.   She needs lipids checked in December 2017.  OK to do with PMD at her yearly physical.  She is meeting her new PMD tomorrow.    Current medicines are reviewed at  length with the patient today.  The patient concerns regarding her medicines were addressed.  The following changes have been made:  No change  Labs/ tests ordered today include:  No orders of the defined types were placed in this encounter.   Recommend 150 minutes/week of aerobic exercise Low fat, low carb, high fiber diet recommended  Disposition:   FU in 1 year   Signed, Larae Grooms, MD  05/26/2016 11:05 AM    [facility] [place], [place], [place]  [address] Phone: [phone]; Fax: [phone]

## 2016-05-26 ENCOUNTER — Encounter: Payer: Self-pay | Admitting: Interventional Cardiology

## 2016-05-26 ENCOUNTER — Ambulatory Visit (INDEPENDENT_AMBULATORY_CARE_PROVIDER_SITE_OTHER): Payer: PPO | Admitting: Interventional Cardiology

## 2016-05-26 ENCOUNTER — Ambulatory Visit (HOSPITAL_COMMUNITY)
Admission: RE | Admit: 2016-05-26 | Discharge: 2016-05-26 | Disposition: A | Discharge status: Home / Self Care | Payer: PPO | Source: Ambulatory Visit | Attending: Cardiology | Admitting: Cardiology

## 2016-05-26 VITALS — BP 140/60 | HR 54 | Ht 63.5 in | Wt 190.0 lb

## 2016-05-26 DIAGNOSIS — I2510 Atherosclerotic heart disease of native coronary artery without angina pectoris: Secondary | ICD-10-CM

## 2016-05-26 DIAGNOSIS — E785 Hyperlipidemia, unspecified: Secondary | ICD-10-CM | POA: Diagnosis not present

## 2016-05-26 DIAGNOSIS — I779 Disorder of arteries and arterioles, unspecified: Secondary | ICD-10-CM | POA: Diagnosis not present

## 2016-05-26 DIAGNOSIS — I10 Essential (primary) hypertension: Secondary | ICD-10-CM

## 2016-05-26 DIAGNOSIS — I6523 Occlusion and stenosis of bilateral carotid arteries: Secondary | ICD-10-CM

## 2016-05-26 DIAGNOSIS — E782 Mixed hyperlipidemia: Secondary | ICD-10-CM | POA: Diagnosis not present

## 2016-05-26 DIAGNOSIS — I739 Peripheral vascular disease, unspecified: Secondary | ICD-10-CM

## 2016-05-26 NOTE — Patient Instructions (Signed)
**Note De-identified Lisa Petty Obfuscation** Medication Instructions:  Same-no changes  Labwork: None  Testing/Procedures: None  Follow-Up: Your physician wants you to follow-up in: 1 year. You will receive a reminder letter in the mail two months in advance. If you don't receive a letter, please call our office to schedule the follow-up appointment.      If you need a refill on your cardiac medications before your next appointment, please call your pharmacy.   

## 2016-05-29 DIAGNOSIS — I10 Essential (primary) hypertension: Secondary | ICD-10-CM | POA: Diagnosis not present

## 2016-05-29 DIAGNOSIS — F411 Generalized anxiety disorder: Secondary | ICD-10-CM | POA: Diagnosis not present

## 2016-05-29 DIAGNOSIS — M47816 Spondylosis without myelopathy or radiculopathy, lumbar region: Secondary | ICD-10-CM | POA: Diagnosis not present

## 2016-05-29 DIAGNOSIS — Z23 Encounter for immunization: Secondary | ICD-10-CM | POA: Diagnosis not present

## 2016-05-29 DIAGNOSIS — I2510 Atherosclerotic heart disease of native coronary artery without angina pectoris: Secondary | ICD-10-CM | POA: Diagnosis not present

## 2016-05-29 DIAGNOSIS — R300 Dysuria: Secondary | ICD-10-CM | POA: Diagnosis not present

## 2016-05-29 DIAGNOSIS — E785 Hyperlipidemia, unspecified: Secondary | ICD-10-CM | POA: Diagnosis not present

## 2016-05-29 DIAGNOSIS — N183 Chronic kidney disease, stage 3 (moderate): Secondary | ICD-10-CM | POA: Diagnosis not present

## 2016-05-29 DIAGNOSIS — Z6834 Body mass index (BMI) 34.0-34.9, adult: Secondary | ICD-10-CM | POA: Diagnosis not present

## 2016-05-29 DIAGNOSIS — G894 Chronic pain syndrome: Secondary | ICD-10-CM | POA: Diagnosis not present

## 2016-05-29 DIAGNOSIS — E559 Vitamin D deficiency, unspecified: Secondary | ICD-10-CM | POA: Diagnosis not present

## 2016-05-29 DIAGNOSIS — N3281 Overactive bladder: Secondary | ICD-10-CM | POA: Diagnosis not present

## 2016-05-29 DIAGNOSIS — I779 Disorder of arteries and arterioles, unspecified: Secondary | ICD-10-CM | POA: Diagnosis not present

## 2016-06-04 ENCOUNTER — Other Ambulatory Visit: Payer: Self-pay | Admitting: Interventional Cardiology

## 2016-06-09 DIAGNOSIS — D1801 Hemangioma of skin and subcutaneous tissue: Secondary | ICD-10-CM | POA: Diagnosis not present

## 2016-06-09 DIAGNOSIS — D485 Neoplasm of uncertain behavior of skin: Secondary | ICD-10-CM | POA: Diagnosis not present

## 2016-06-09 DIAGNOSIS — L821 Other seborrheic keratosis: Secondary | ICD-10-CM | POA: Diagnosis not present

## 2016-06-09 DIAGNOSIS — L570 Actinic keratosis: Secondary | ICD-10-CM | POA: Diagnosis not present

## 2016-06-09 DIAGNOSIS — L720 Epidermal cyst: Secondary | ICD-10-CM | POA: Diagnosis not present

## 2016-06-16 DIAGNOSIS — L570 Actinic keratosis: Secondary | ICD-10-CM | POA: Diagnosis not present

## 2016-06-16 DIAGNOSIS — L0100 Impetigo, unspecified: Secondary | ICD-10-CM | POA: Diagnosis not present

## 2016-06-20 DIAGNOSIS — H1851 Endothelial corneal dystrophy: Secondary | ICD-10-CM | POA: Diagnosis not present

## 2016-06-26 DIAGNOSIS — S81801A Unspecified open wound, right lower leg, initial encounter: Secondary | ICD-10-CM | POA: Diagnosis not present

## 2016-06-26 DIAGNOSIS — N183 Chronic kidney disease, stage 3 (moderate): Secondary | ICD-10-CM | POA: Diagnosis not present

## 2016-06-26 DIAGNOSIS — I10 Essential (primary) hypertension: Secondary | ICD-10-CM | POA: Diagnosis not present

## 2016-06-26 DIAGNOSIS — I2510 Atherosclerotic heart disease of native coronary artery without angina pectoris: Secondary | ICD-10-CM | POA: Diagnosis not present

## 2016-07-04 DIAGNOSIS — H1851 Endothelial corneal dystrophy: Secondary | ICD-10-CM | POA: Diagnosis not present

## 2016-07-10 ENCOUNTER — Other Ambulatory Visit: Payer: Self-pay | Admitting: Interventional Cardiology

## 2016-07-14 DIAGNOSIS — L570 Actinic keratosis: Secondary | ICD-10-CM | POA: Diagnosis not present

## 2016-07-14 DIAGNOSIS — B351 Tinea unguium: Secondary | ICD-10-CM | POA: Diagnosis not present

## 2016-07-19 ENCOUNTER — Other Ambulatory Visit: Payer: Self-pay | Admitting: Interventional Cardiology

## 2016-07-24 DIAGNOSIS — N3946 Mixed incontinence: Secondary | ICD-10-CM | POA: Diagnosis not present

## 2016-07-24 DIAGNOSIS — R350 Frequency of micturition: Secondary | ICD-10-CM | POA: Diagnosis not present

## 2016-07-31 DIAGNOSIS — H1851 Endothelial corneal dystrophy: Secondary | ICD-10-CM | POA: Diagnosis not present

## 2016-08-28 DIAGNOSIS — E785 Hyperlipidemia, unspecified: Secondary | ICD-10-CM | POA: Diagnosis not present

## 2016-08-28 DIAGNOSIS — I10 Essential (primary) hypertension: Secondary | ICD-10-CM | POA: Diagnosis not present

## 2016-08-28 DIAGNOSIS — I2510 Atherosclerotic heart disease of native coronary artery without angina pectoris: Secondary | ICD-10-CM | POA: Diagnosis not present

## 2016-08-28 DIAGNOSIS — N183 Chronic kidney disease, stage 3 (moderate): Secondary | ICD-10-CM | POA: Diagnosis not present

## 2016-08-28 DIAGNOSIS — F411 Generalized anxiety disorder: Secondary | ICD-10-CM | POA: Diagnosis not present

## 2016-08-28 DIAGNOSIS — R05 Cough: Secondary | ICD-10-CM | POA: Diagnosis not present

## 2016-10-13 DIAGNOSIS — H40033 Anatomical narrow angle, bilateral: Secondary | ICD-10-CM | POA: Diagnosis not present

## 2016-10-13 DIAGNOSIS — H1851 Endothelial corneal dystrophy: Secondary | ICD-10-CM | POA: Diagnosis not present

## 2016-11-14 DIAGNOSIS — H04123 Dry eye syndrome of bilateral lacrimal glands: Secondary | ICD-10-CM | POA: Diagnosis not present

## 2016-11-17 DIAGNOSIS — F411 Generalized anxiety disorder: Secondary | ICD-10-CM | POA: Diagnosis not present

## 2016-11-17 DIAGNOSIS — I10 Essential (primary) hypertension: Secondary | ICD-10-CM | POA: Diagnosis not present

## 2016-11-17 DIAGNOSIS — E785 Hyperlipidemia, unspecified: Secondary | ICD-10-CM | POA: Diagnosis not present

## 2016-11-17 DIAGNOSIS — N183 Chronic kidney disease, stage 3 (moderate): Secondary | ICD-10-CM | POA: Diagnosis not present

## 2016-11-17 DIAGNOSIS — I2510 Atherosclerotic heart disease of native coronary artery without angina pectoris: Secondary | ICD-10-CM | POA: Diagnosis not present

## 2016-11-17 DIAGNOSIS — R05 Cough: Secondary | ICD-10-CM | POA: Diagnosis not present

## 2016-11-17 DIAGNOSIS — Z0000 Encounter for general adult medical examination without abnormal findings: Secondary | ICD-10-CM | POA: Diagnosis not present

## 2016-11-17 DIAGNOSIS — E559 Vitamin D deficiency, unspecified: Secondary | ICD-10-CM | POA: Diagnosis not present

## 2016-11-20 ENCOUNTER — Other Ambulatory Visit: Payer: Self-pay | Admitting: Internal Medicine

## 2016-11-20 DIAGNOSIS — Z1231 Encounter for screening mammogram for malignant neoplasm of breast: Secondary | ICD-10-CM

## 2016-12-12 ENCOUNTER — Ambulatory Visit
Admission: RE | Admit: 2016-12-12 | Discharge: 2016-12-12 | Disposition: A | Discharge status: Home / Self Care | Payer: PPO | Source: Ambulatory Visit | Attending: Internal Medicine | Admitting: Internal Medicine

## 2016-12-12 DIAGNOSIS — Z1231 Encounter for screening mammogram for malignant neoplasm of breast: Secondary | ICD-10-CM

## 2016-12-15 DIAGNOSIS — M170 Bilateral primary osteoarthritis of knee: Secondary | ICD-10-CM | POA: Diagnosis not present

## 2016-12-15 DIAGNOSIS — I2510 Atherosclerotic heart disease of native coronary artery without angina pectoris: Secondary | ICD-10-CM | POA: Diagnosis not present

## 2016-12-15 DIAGNOSIS — M47816 Spondylosis without myelopathy or radiculopathy, lumbar region: Secondary | ICD-10-CM | POA: Diagnosis not present

## 2016-12-15 DIAGNOSIS — F411 Generalized anxiety disorder: Secondary | ICD-10-CM | POA: Diagnosis not present

## 2016-12-20 DIAGNOSIS — M545 Low back pain: Secondary | ICD-10-CM | POA: Diagnosis not present

## 2016-12-20 DIAGNOSIS — M2241 Chondromalacia patellae, right knee: Secondary | ICD-10-CM | POA: Diagnosis not present

## 2016-12-20 DIAGNOSIS — M2242 Chondromalacia patellae, left knee: Secondary | ICD-10-CM | POA: Diagnosis not present

## 2017-01-12 DIAGNOSIS — L821 Other seborrheic keratosis: Secondary | ICD-10-CM | POA: Diagnosis not present

## 2017-01-12 DIAGNOSIS — D1800 Hemangioma unspecified site: Secondary | ICD-10-CM | POA: Diagnosis not present

## 2017-01-12 DIAGNOSIS — D225 Melanocytic nevi of trunk: Secondary | ICD-10-CM | POA: Diagnosis not present

## 2017-01-12 DIAGNOSIS — L910 Hypertrophic scar: Secondary | ICD-10-CM | POA: Diagnosis not present

## 2017-01-12 DIAGNOSIS — L814 Other melanin hyperpigmentation: Secondary | ICD-10-CM | POA: Diagnosis not present

## 2017-01-12 DIAGNOSIS — L728 Other follicular cysts of the skin and subcutaneous tissue: Secondary | ICD-10-CM | POA: Diagnosis not present

## 2017-01-22 DIAGNOSIS — M545 Low back pain: Secondary | ICD-10-CM | POA: Diagnosis not present

## 2017-02-09 DIAGNOSIS — H1851 Endothelial corneal dystrophy: Secondary | ICD-10-CM | POA: Diagnosis not present

## 2017-02-19 DIAGNOSIS — M25561 Pain in right knee: Secondary | ICD-10-CM | POA: Diagnosis not present

## 2017-03-01 DIAGNOSIS — M25561 Pain in right knee: Secondary | ICD-10-CM | POA: Diagnosis not present

## 2017-03-07 DIAGNOSIS — M545 Low back pain: Secondary | ICD-10-CM | POA: Diagnosis not present

## 2017-03-09 DIAGNOSIS — M5416 Radiculopathy, lumbar region: Secondary | ICD-10-CM | POA: Diagnosis not present

## 2017-03-12 ENCOUNTER — Telehealth: Payer: Self-pay

## 2017-03-12 NOTE — Telephone Encounter (Signed)
Request for  clearance:  1. What type of procedure is being performed? Bilateral L4 selective nerve root block   2. When is this procedure scheduled? TBD   3. Are there any medications that need to be held prior to procedure and how long? Plavix 7 days prior to procedure   4. Name of physician performing surgery? Dr. Mina Marble   5. What is your office phone and fax number? P- [phone], [phone]

## 2017-03-13 NOTE — Telephone Encounter (Signed)
No further cardiac testing required.  OK to hold plavix 7 days prior to procedure.

## 2017-03-13 NOTE — Telephone Encounter (Signed)
Clearance faxed to Dr. Leland Her office. Confirmation received that fax was successful.

## 2017-04-10 DIAGNOSIS — M5416 Radiculopathy, lumbar region: Secondary | ICD-10-CM | POA: Diagnosis not present

## 2017-04-17 DIAGNOSIS — H1851 Endothelial corneal dystrophy: Secondary | ICD-10-CM | POA: Diagnosis not present

## 2017-04-18 ENCOUNTER — Telehealth: Payer: Self-pay | Admitting: Interventional Cardiology

## 2017-04-18 DIAGNOSIS — I739 Peripheral vascular disease, unspecified: Principal | ICD-10-CM

## 2017-04-18 DIAGNOSIS — I779 Disorder of arteries and arterioles, unspecified: Secondary | ICD-10-CM

## 2017-04-18 NOTE — Telephone Encounter (Signed)
Yes.  She should have annual carotid Doppler.

## 2017-04-18 NOTE — Telephone Encounter (Signed)
New message      Pt is scheduled for her annual follow up on 06-06-17.  She want to know if she is due for a carotid ultrasound?

## 2017-04-18 NOTE — Telephone Encounter (Signed)
Pt has had carotid US x 2 yrs.  Do you want to repeat this again this year?

## 2017-04-18 NOTE — Telephone Encounter (Signed)
Called and left detailed message on patient's VM (DPR on file) making patient aware that Dr. Irish Lack wants her to have Carotid U/S done yearly. Made patient aware that the order has been placed and that she should get a call from the scheduling department to schedule. Instructed for the patient to call back with any questions.

## 2017-05-01 DIAGNOSIS — M5416 Radiculopathy, lumbar region: Secondary | ICD-10-CM | POA: Diagnosis not present

## 2017-05-07 ENCOUNTER — Encounter (HOSPITAL_COMMUNITY): Payer: PPO

## 2017-05-08 DIAGNOSIS — H1851 Endothelial corneal dystrophy: Secondary | ICD-10-CM | POA: Diagnosis not present

## 2017-05-31 DIAGNOSIS — H1851 Endothelial corneal dystrophy: Secondary | ICD-10-CM | POA: Diagnosis not present

## 2017-06-06 ENCOUNTER — Encounter: Payer: Self-pay | Admitting: Interventional Cardiology

## 2017-06-06 ENCOUNTER — Ambulatory Visit (INDEPENDENT_AMBULATORY_CARE_PROVIDER_SITE_OTHER): Payer: PPO | Admitting: Interventional Cardiology

## 2017-06-06 ENCOUNTER — Ambulatory Visit (HOSPITAL_COMMUNITY)
Admission: RE | Admit: 2017-06-06 | Payer: Self-pay | Source: Ambulatory Visit | Attending: Interventional Cardiology | Admitting: Interventional Cardiology

## 2017-06-06 ENCOUNTER — Inpatient Hospital Stay (HOSPITAL_COMMUNITY): Admission: RE | Admit: 2017-06-06 | Payer: PPO | Source: Ambulatory Visit

## 2017-06-06 VITALS — BP 132/66 | HR 60 | Ht 63.5 in | Wt 201.8 lb

## 2017-06-06 DIAGNOSIS — I2510 Atherosclerotic heart disease of native coronary artery without angina pectoris: Secondary | ICD-10-CM

## 2017-06-06 DIAGNOSIS — M7989 Other specified soft tissue disorders: Secondary | ICD-10-CM | POA: Diagnosis not present

## 2017-06-06 DIAGNOSIS — I10 Essential (primary) hypertension: Secondary | ICD-10-CM

## 2017-06-06 DIAGNOSIS — E782 Mixed hyperlipidemia: Secondary | ICD-10-CM | POA: Diagnosis not present

## 2017-06-06 DIAGNOSIS — I6523 Occlusion and stenosis of bilateral carotid arteries: Secondary | ICD-10-CM | POA: Diagnosis not present

## 2017-06-06 MED ORDER — FUROSEMIDE 40 MG PO TABS: 40.0000 mg | ORAL_TABLET | Freq: Every day | ORAL | 3 refills | Status: DC

## 2017-06-06 NOTE — Progress Notes (Signed)
Cardiology Office Note   Date:  06/06/2017   ID:  Lisa Petty, Lisa Petty 07-Dec-1935, MRN [id number]  PCP:  Lottie Dawson, MD    No chief complaint on file.  CAD, carotid artery disease  Wt Readings from Last 3 Encounters:  05/26/16 190 lb (86.2 kg)  05/27/15 191 lb 6.4 oz (86.8 kg)  05/05/14 189 lb (85.7 kg)       History of Present Illness: Lisa Petty is a 81 y.o. female  who has an occluded carotid artery.  She also had an LAD stent in 2000.   Denies : Chest pain. Dizziness. Nitroglycerin use. Orthopnea. Palpitations. Paroxysmal nocturnal dyspnea. Shortness of breath. Syncope.   She reports leg edema.  Left leg is chronically worse than the right leg although she cannot remember any injury.  Overall, she feels that she is doing well.  No symptoms like what she had prior to her LAD stent.  She gets her carotid scan annually.      Past Medical History:  Diagnosis Date  . CKD (chronic kidney disease), stage III ([place])   . Coronary atherosclerosis of native coronary artery    s/p stent to LAD 2000-no MI, right ICA 100% stenosis;left side followed by Dr. Irish Lack  . Essential hypertension, benign   . Low back pain   . Mixed hyperlipidemia   . Occlusion and stenosis of carotid artery without mention of cerebral infarction   . Osteopenia    mild to normal (BMD 08/18/09 normal)  . Overactive bladder    on detrol   . Vitamin D deficiency    repleted by [facility]    Past Surgical History:  Procedure Laterality Date  . AUGMENTATION MAMMAPLASTY Bilateral 1976     Current Outpatient Prescriptions  Medication Sig Dispense Refill  . amLODipine (NORVASC) 5 MG tablet TAKE ONE TABLET BY MOUTH ONCE DAILY FOR BLOOD PRESSURE 30 tablet 11  . aspirin 81 MG tablet Take 81 mg by mouth daily.    Marland Kitchen atenolol (TENORMIN) 100 MG tablet Take 1 tablet (100 mg total) by mouth daily. 30 tablet 11  . atorvastatin (LIPITOR) 80 MG tablet Take 80 mg by mouth at bedtime.    .  Calcium Carbonate-Vitamin D (RA CALCIUM PLUS VITAMIN D) 600-400 MG-UNIT per tablet Take 1 tablet by mouth 2 (two) times daily.    . clopidogrel (PLAVIX) 75 MG tablet TAKE ONE TABLET BY MOUTH ONCE DAILY 30 tablet 10  . diltiazem (CARDIZEM CD) 240 MG 24 hr capsule Take 240 mg by mouth daily.    . DUREZOL 0.05 % EMUL Place into both eyes daily.    Marland Kitchen ezetimibe (ZETIA) 10 MG tablet TAKE ONE TABLET BY MOUTH ONCE DAILY 30 tablet 10  . gentamicin (GARAMYCIN) 0.3 % ophthalmic solution     . hydrochlorothiazide (HYDRODIURIL) 25 MG tablet TAKE ONE TABLET BY MOUTH ONCE DAILY 30 tablet 11  . ILEVRO 0.3 % SUSP     . LORazepam (ATIVAN) 0.5 MG tablet Take 0.5 mg by mouth 2 (two) times daily as needed for anxiety.    Marland Kitchen losartan (COZAAR) 50 MG tablet Take 50 mg by mouth daily.    . nitroGLYCERIN (NITROSTAT) 0.4 MG SL tablet Place 0.4 mg under the tongue every 5 (five) minutes as needed for chest pain.    Marland Kitchen PROLENSA 0.07 % SOLN     . tolterodine (DETROL LA) 4 MG 24 hr capsule Take 4 mg by mouth daily.    . traMADol (ULTRAM) 50  MG tablet Take 50 mg by mouth every 6 (six) hours as needed. (PAIN)    . vitamin A 10000 UNIT capsule Take 10,000 Units by mouth daily.    . vitamin E 400 UNIT capsule Take 400 Units by mouth daily.     No current facility-administered medications for this visit.     Allergies:   Patient has no known allergies.    Social History:  The patient  reports that she has never smoked. She has never used smokeless tobacco. She reports that she does not drink alcohol.   Family History:  The patient's family history includes CAD in her brother, father, and son; Dementia in her mother; Diabetes in her sister; Heart attack in her brother, father, and son; Lung cancer in her father.    ROS:  Please see the history of present illness.   Otherwise, review of systems are positive for LE edema.   All other systems are reviewed and negative.    PHYSICAL EXAM: VS:  Ht 5' 3.5" (1.613 m)  , BMI There  is no height or weight on file to calculate BMI. GEN: Well nourished, well developed, in no acute distress  HEENT: normal  Neck: no JVD, carotid bruits, or masses Cardiac: RRR; no murmurs, rubs, or gallops; bilateral LE edema , L > R Respiratory:  clear to auscultation bilaterally, normal work of breathing GI: soft, nontender, nondistended, + BS MS: no deformity or atrophy  Skin: warm and dry, no rash Neuro:  Strength and sensation are intact Psych: euthymic mood, full affect   EKG:   The ekg ordered today demonstrates NSR, no ST changes   Recent Labs: No results found for requested labs within last 8760 hours.   Lipid Panel    Component Value Date/Time   CHOL 143 08/04/2014 0900   TRIG 118.0 08/04/2014 0900   HDL 47.20 08/04/2014 0900   CHOLHDL 3 08/04/2014 0900   VLDL 23.6 08/04/2014 0900   LDLCALC 72 08/04/2014 0900     Other studies Reviewed: Additional studies/ records that were reviewed today with results demonstrating: 2017 carotid DOppler reviewed.   ASSESSMENT AND PLAN:  1. CAD: No angina on current medical therapy.  History of LAD stent.  Continue aggressive secondary prevention. 2. Carotid artery disease.  Occluded right internal carotid.  Screening annually to make sure there is no significant left internal carotid disease developing. 3. Hyperlipidemia: LDL 77.  Continue atorvastatin. 4. Lower extremity swelling: I suspect this is venous insufficiency.  No risk factors for DVT.  Will check lower extremity venous Doppler to assess for reflux since her edema has gotten worse.  Will stop HCTZ.  Start Lasix 40 mg daily.  Check electrolytes in a week.  If she has any side effects or issues with the Lasix, we can go back to the HCTZ.  We also talked about using compression stockings.  She thinks she has some at home. 5. Hypertension: Blood pressure well controlled.  Continue current medicines.  I think her swelling is not related to amlodipine but more so related to  venous insufficiency.  The swelling is resolved by morning.   Current medicines are reviewed at length with the patient today.  The patient concerns regarding her medicines were addressed.  The following changes have been made: Change in diuretic  Labs/ tests ordered today include: Electrolytes checked in a week No orders of the defined types were placed in this encounter.   Recommend 150 minutes/week of aerobic exercise Low fat, low  carb, high fiber diet recommended  Disposition:   FU in 1 year   Signed, Larae Grooms, MD  06/06/2017 11:36 AM    [facility] [address], [place], [place]  [address] Phone: [phone]; Fax: [phone]

## 2017-06-06 NOTE — Patient Instructions (Signed)
Medication Instructions:  Your physician has recommended you make the following change in your medication:   1. STOP Hydrochlorothiazide  2. START furosemide (lasix) 40 mg (1 tablet) daily  Labwork: Your physician recommends that you return for lab work in: 1 week for BMET  Testing/Procedures: Your physician recommends that you have a lower extremity venous doppler.   Follow-Up: Your physician wants you to follow-up in: 1 year with Dr. Irish Lack. You will receive a reminder letter in the mail two months in advance. If you don't receive a letter, please call our office to schedule the follow-up appointment.   Any Other Special Instructions Will Be Listed Below (If Applicable).     If you need a refill on your cardiac medications before your next appointment, please call your pharmacy.

## 2017-06-06 NOTE — Addendum Note (Signed)
Addended by: Drue Novel I on: 06/06/2017 04:50 PM   Modules accepted: Orders

## 2017-06-07 ENCOUNTER — Other Ambulatory Visit: Payer: Self-pay | Admitting: *Deleted

## 2017-06-07 MED ORDER — FUROSEMIDE 20 MG PO TABS
40.0000 mg | ORAL_TABLET | Freq: Every day | ORAL | 3 refills | Status: DC
Start: 2017-06-07 — End: 2017-06-14

## 2017-06-12 ENCOUNTER — Ambulatory Visit (HOSPITAL_COMMUNITY)
Admission: RE | Admit: 2017-06-12 | Discharge: 2017-06-12 | Disposition: A | Discharge status: Home / Self Care | Payer: PPO | Source: Ambulatory Visit | Attending: Cardiovascular Disease | Admitting: Cardiovascular Disease

## 2017-06-12 ENCOUNTER — Ambulatory Visit (HOSPITAL_BASED_OUTPATIENT_CLINIC_OR_DEPARTMENT_OTHER)
Admission: RE | Admit: 2017-06-12 | Discharge: 2017-06-12 | Disposition: A | Discharge status: Home / Self Care | Payer: PPO | Source: Ambulatory Visit | Attending: Cardiovascular Disease | Admitting: Cardiovascular Disease

## 2017-06-12 DIAGNOSIS — M5416 Radiculopathy, lumbar region: Secondary | ICD-10-CM | POA: Diagnosis not present

## 2017-06-12 DIAGNOSIS — M7989 Other specified soft tissue disorders: Secondary | ICD-10-CM

## 2017-06-12 DIAGNOSIS — I779 Disorder of arteries and arterioles, unspecified: Secondary | ICD-10-CM

## 2017-06-12 DIAGNOSIS — I6523 Occlusion and stenosis of bilateral carotid arteries: Secondary | ICD-10-CM | POA: Diagnosis not present

## 2017-06-12 DIAGNOSIS — I739 Peripheral vascular disease, unspecified: Principal | ICD-10-CM

## 2017-06-13 ENCOUNTER — Other Ambulatory Visit: Payer: PPO | Admitting: *Deleted

## 2017-06-13 DIAGNOSIS — M7989 Other specified soft tissue disorders: Secondary | ICD-10-CM

## 2017-06-13 DIAGNOSIS — I2510 Atherosclerotic heart disease of native coronary artery without angina pectoris: Secondary | ICD-10-CM

## 2017-06-13 LAB — BASIC METABOLIC PANEL
BUN / CREAT RATIO: 19 (ref 12–28)
BUN: 25 mg/dL (ref 8–27)
CO2: 25 mmol/L (ref 20–29)
CREATININE: 1.32 mg/dL — AB (ref 0.57–1.00)
Calcium: 9.9 mg/dL (ref 8.7–10.3)
Chloride: 96 mmol/L (ref 96–106)
GFR, EST AFRICAN AMERICAN: 44 mL/min/{1.73_m2} — AB (ref >59)
GFR, EST NON AFRICAN AMERICAN: 38 mL/min/{1.73_m2} — AB (ref >59)
GLUCOSE: 117 mg/dL — AB (ref 65–99)
Potassium: 3.2 mmol/L — ABNORMAL LOW (ref 3.5–5.2)
SODIUM: 140 mmol/L (ref 134–144)

## 2017-06-14 ENCOUNTER — Telehealth: Payer: Self-pay

## 2017-06-14 DIAGNOSIS — I10 Essential (primary) hypertension: Secondary | ICD-10-CM

## 2017-06-14 MED ORDER — FUROSEMIDE 20 MG PO TABS: 20.0000 mg | ORAL_TABLET | Freq: Every day | ORAL | 3 refills | Status: DC

## 2017-06-14 NOTE — Telephone Encounter (Signed)
Called and made patient aware of her carotid results, LE doppler results, and lab results and recommendations to decrease lasix to 20 mg daily and to eat foods that are high in potassium and to come back to recheck her labs in 1 week. BMET ordered and scheduled for 11/9. Patient verbalizes understanding and thanked me for the call.

## 2017-06-14 NOTE — Telephone Encounter (Signed)
-----   Message from Jettie Booze, MD sent at 06/14/2017  4:49 PM EDT ----- Decrease to Lasix 20 mg daily.  Eat potassium rich foods and recheck BMet in approximately 1 week.

## 2017-06-14 NOTE — Telephone Encounter (Signed)
-----   Message from Jettie Booze, MD sent at 06/14/2017  5:14 PM EDT ----- Left carotid is widely patent. Repeat in one year.

## 2017-06-14 NOTE — Telephone Encounter (Signed)
-----   Message from Jettie Booze, MD sent at 06/14/2017  4:59 PM EDT ----- No DVT.

## 2017-06-20 DIAGNOSIS — N183 Chronic kidney disease, stage 3 (moderate): Secondary | ICD-10-CM | POA: Diagnosis not present

## 2017-06-20 DIAGNOSIS — F411 Generalized anxiety disorder: Secondary | ICD-10-CM | POA: Diagnosis not present

## 2017-06-20 DIAGNOSIS — I10 Essential (primary) hypertension: Secondary | ICD-10-CM | POA: Diagnosis not present

## 2017-06-20 DIAGNOSIS — Z23 Encounter for immunization: Secondary | ICD-10-CM | POA: Diagnosis not present

## 2017-06-20 DIAGNOSIS — Z780 Asymptomatic menopausal state: Secondary | ICD-10-CM | POA: Diagnosis not present

## 2017-06-20 DIAGNOSIS — M1991 Primary osteoarthritis, unspecified site: Secondary | ICD-10-CM | POA: Diagnosis not present

## 2017-06-20 DIAGNOSIS — R600 Localized edema: Secondary | ICD-10-CM | POA: Diagnosis not present

## 2017-06-22 ENCOUNTER — Other Ambulatory Visit: Payer: PPO | Admitting: *Deleted

## 2017-06-22 DIAGNOSIS — I10 Essential (primary) hypertension: Secondary | ICD-10-CM | POA: Diagnosis not present

## 2017-06-22 LAB — BASIC METABOLIC PANEL
BUN / CREAT RATIO: 10 — AB (ref 12–28)
BUN: 13 mg/dL (ref 8–27)
CO2: 24 mmol/L (ref 20–29)
Calcium: 9.7 mg/dL (ref 8.7–10.3)
Chloride: 105 mmol/L (ref 96–106)
Creatinine, Ser: 1.31 mg/dL — ABNORMAL HIGH (ref 0.57–1.00)
GFR calc Af Amer: 45 mL/min/{1.73_m2} — ABNORMAL LOW (ref >59)
GFR calc non Af Amer: 39 mL/min/{1.73_m2} — ABNORMAL LOW (ref >59)
GLUCOSE: 107 mg/dL — AB (ref 65–99)
POTASSIUM: 4.1 mmol/L (ref 3.5–5.2)
SODIUM: 144 mmol/L (ref 134–144)

## 2017-06-27 DIAGNOSIS — Z780 Asymptomatic menopausal state: Secondary | ICD-10-CM | POA: Diagnosis not present

## 2017-06-27 DIAGNOSIS — M8588 Other specified disorders of bone density and structure, other site: Secondary | ICD-10-CM | POA: Diagnosis not present

## 2017-07-06 DIAGNOSIS — H1851 Endothelial corneal dystrophy: Secondary | ICD-10-CM | POA: Diagnosis not present

## 2017-08-08 ENCOUNTER — Other Ambulatory Visit: Payer: Self-pay | Admitting: Interventional Cardiology

## 2017-08-29 ENCOUNTER — Other Ambulatory Visit: Payer: Self-pay | Admitting: Interventional Cardiology

## 2017-08-30 DIAGNOSIS — H04123 Dry eye syndrome of bilateral lacrimal glands: Secondary | ICD-10-CM | POA: Diagnosis not present

## 2017-09-02 DIAGNOSIS — H10013 Acute follicular conjunctivitis, bilateral: Secondary | ICD-10-CM | POA: Diagnosis not present

## 2017-09-10 DIAGNOSIS — M545 Low back pain: Secondary | ICD-10-CM | POA: Diagnosis not present

## 2017-09-28 DIAGNOSIS — H1851 Endothelial corneal dystrophy: Secondary | ICD-10-CM | POA: Diagnosis not present

## 2017-10-01 DIAGNOSIS — M170 Bilateral primary osteoarthritis of knee: Secondary | ICD-10-CM | POA: Diagnosis not present

## 2017-10-01 DIAGNOSIS — I2510 Atherosclerotic heart disease of native coronary artery without angina pectoris: Secondary | ICD-10-CM | POA: Diagnosis not present

## 2017-10-01 DIAGNOSIS — M1991 Primary osteoarthritis, unspecified site: Secondary | ICD-10-CM | POA: Diagnosis not present

## 2017-10-01 DIAGNOSIS — N183 Chronic kidney disease, stage 3 (moderate): Secondary | ICD-10-CM | POA: Diagnosis not present

## 2017-10-01 DIAGNOSIS — E785 Hyperlipidemia, unspecified: Secondary | ICD-10-CM | POA: Diagnosis not present

## 2017-10-01 DIAGNOSIS — I10 Essential (primary) hypertension: Secondary | ICD-10-CM | POA: Diagnosis not present

## 2017-10-01 DIAGNOSIS — M47816 Spondylosis without myelopathy or radiculopathy, lumbar region: Secondary | ICD-10-CM | POA: Diagnosis not present

## 2017-10-04 DIAGNOSIS — N3946 Mixed incontinence: Secondary | ICD-10-CM | POA: Diagnosis not present

## 2017-10-04 DIAGNOSIS — R350 Frequency of micturition: Secondary | ICD-10-CM | POA: Diagnosis not present

## 2017-10-12 DIAGNOSIS — N183 Chronic kidney disease, stage 3 (moderate): Secondary | ICD-10-CM | POA: Diagnosis not present

## 2017-10-12 DIAGNOSIS — M170 Bilateral primary osteoarthritis of knee: Secondary | ICD-10-CM | POA: Diagnosis not present

## 2017-10-12 DIAGNOSIS — M47816 Spondylosis without myelopathy or radiculopathy, lumbar region: Secondary | ICD-10-CM | POA: Diagnosis not present

## 2017-10-12 DIAGNOSIS — I2510 Atherosclerotic heart disease of native coronary artery without angina pectoris: Secondary | ICD-10-CM | POA: Diagnosis not present

## 2017-10-12 DIAGNOSIS — E785 Hyperlipidemia, unspecified: Secondary | ICD-10-CM | POA: Diagnosis not present

## 2017-10-12 DIAGNOSIS — I10 Essential (primary) hypertension: Secondary | ICD-10-CM | POA: Diagnosis not present

## 2017-10-12 DIAGNOSIS — H43393 Other vitreous opacities, bilateral: Secondary | ICD-10-CM | POA: Diagnosis not present

## 2017-10-12 DIAGNOSIS — H40033 Anatomical narrow angle, bilateral: Secondary | ICD-10-CM | POA: Diagnosis not present

## 2017-10-19 DIAGNOSIS — J209 Acute bronchitis, unspecified: Secondary | ICD-10-CM | POA: Diagnosis not present

## 2017-11-03 DIAGNOSIS — H1851 Endothelial corneal dystrophy: Secondary | ICD-10-CM | POA: Diagnosis not present

## 2017-11-19 DIAGNOSIS — F411 Generalized anxiety disorder: Secondary | ICD-10-CM | POA: Diagnosis not present

## 2017-11-19 DIAGNOSIS — I10 Essential (primary) hypertension: Secondary | ICD-10-CM | POA: Diagnosis not present

## 2017-11-19 DIAGNOSIS — N183 Chronic kidney disease, stage 3 (moderate): Secondary | ICD-10-CM | POA: Diagnosis not present

## 2017-11-19 DIAGNOSIS — M47816 Spondylosis without myelopathy or radiculopathy, lumbar region: Secondary | ICD-10-CM | POA: Diagnosis not present

## 2017-11-19 DIAGNOSIS — E559 Vitamin D deficiency, unspecified: Secondary | ICD-10-CM | POA: Diagnosis not present

## 2017-11-19 DIAGNOSIS — M170 Bilateral primary osteoarthritis of knee: Secondary | ICD-10-CM | POA: Diagnosis not present

## 2017-11-19 DIAGNOSIS — Z0000 Encounter for general adult medical examination without abnormal findings: Secondary | ICD-10-CM | POA: Diagnosis not present

## 2017-11-19 DIAGNOSIS — Z1389 Encounter for screening for other disorder: Secondary | ICD-10-CM | POA: Diagnosis not present

## 2017-11-19 DIAGNOSIS — I2510 Atherosclerotic heart disease of native coronary artery without angina pectoris: Secondary | ICD-10-CM | POA: Diagnosis not present

## 2017-11-19 DIAGNOSIS — H1851 Endothelial corneal dystrophy: Secondary | ICD-10-CM | POA: Diagnosis not present

## 2017-11-19 DIAGNOSIS — I779 Disorder of arteries and arterioles, unspecified: Secondary | ICD-10-CM | POA: Diagnosis not present

## 2017-11-19 DIAGNOSIS — N3281 Overactive bladder: Secondary | ICD-10-CM | POA: Diagnosis not present

## 2017-11-19 DIAGNOSIS — Z7189 Other specified counseling: Secondary | ICD-10-CM | POA: Diagnosis not present

## 2017-11-27 DIAGNOSIS — H1851 Endothelial corneal dystrophy: Secondary | ICD-10-CM | POA: Diagnosis not present

## 2017-12-11 ENCOUNTER — Other Ambulatory Visit: Payer: Self-pay | Admitting: *Deleted

## 2017-12-11 ENCOUNTER — Telehealth: Payer: Self-pay | Admitting: Interventional Cardiology

## 2017-12-11 MED ORDER — FUROSEMIDE 20 MG PO TABS: 20.0000 mg | ORAL_TABLET | Freq: Every day | ORAL | 1 refills | Status: DC

## 2017-12-11 NOTE — Telephone Encounter (Signed)
New message     *STAT* If patient is at the pharmacy, call can be transferred to refill team.   1. Which medications need to be refilled? (please list name of each medication and dose if known) furosemide (LASIX) 20 MG tablet  2. Which pharmacy/location (including street and city if local pharmacy) is medication to be sent to? [facility] ([facility] [address]  3. Do they need a 30 day or 90 day supply? [place]

## 2017-12-21 DIAGNOSIS — H1851 Endothelial corneal dystrophy: Secondary | ICD-10-CM | POA: Diagnosis not present

## 2018-01-11 DIAGNOSIS — H1851 Endothelial corneal dystrophy: Secondary | ICD-10-CM | POA: Diagnosis not present

## 2018-02-01 DIAGNOSIS — E785 Hyperlipidemia, unspecified: Secondary | ICD-10-CM | POA: Diagnosis not present

## 2018-02-01 DIAGNOSIS — N183 Chronic kidney disease, stage 3 (moderate): Secondary | ICD-10-CM | POA: Diagnosis not present

## 2018-02-01 DIAGNOSIS — I10 Essential (primary) hypertension: Secondary | ICD-10-CM | POA: Diagnosis not present

## 2018-02-01 DIAGNOSIS — M47816 Spondylosis without myelopathy or radiculopathy, lumbar region: Secondary | ICD-10-CM | POA: Diagnosis not present

## 2018-02-01 DIAGNOSIS — H1851 Endothelial corneal dystrophy: Secondary | ICD-10-CM | POA: Diagnosis not present

## 2018-02-01 DIAGNOSIS — I2510 Atherosclerotic heart disease of native coronary artery without angina pectoris: Secondary | ICD-10-CM | POA: Diagnosis not present

## 2018-02-01 DIAGNOSIS — M170 Bilateral primary osteoarthritis of knee: Secondary | ICD-10-CM | POA: Diagnosis not present

## 2018-02-06 DIAGNOSIS — L738 Other specified follicular disorders: Secondary | ICD-10-CM | POA: Diagnosis not present

## 2018-02-06 DIAGNOSIS — L821 Other seborrheic keratosis: Secondary | ICD-10-CM | POA: Diagnosis not present

## 2018-02-06 DIAGNOSIS — D225 Melanocytic nevi of trunk: Secondary | ICD-10-CM | POA: Diagnosis not present

## 2018-02-06 DIAGNOSIS — D229 Melanocytic nevi, unspecified: Secondary | ICD-10-CM | POA: Diagnosis not present

## 2018-02-06 DIAGNOSIS — L820 Inflamed seborrheic keratosis: Secondary | ICD-10-CM | POA: Diagnosis not present

## 2018-02-06 DIAGNOSIS — D1801 Hemangioma of skin and subcutaneous tissue: Secondary | ICD-10-CM | POA: Diagnosis not present

## 2018-02-06 DIAGNOSIS — L739 Follicular disorder, unspecified: Secondary | ICD-10-CM | POA: Diagnosis not present

## 2018-02-06 DIAGNOSIS — L814 Other melanin hyperpigmentation: Secondary | ICD-10-CM | POA: Diagnosis not present

## 2018-02-25 DIAGNOSIS — M545 Low back pain: Secondary | ICD-10-CM | POA: Diagnosis not present

## 2018-03-11 DIAGNOSIS — H1851 Endothelial corneal dystrophy: Secondary | ICD-10-CM | POA: Diagnosis not present

## 2018-03-11 DIAGNOSIS — Z961 Presence of intraocular lens: Secondary | ICD-10-CM | POA: Diagnosis not present

## 2018-03-11 DIAGNOSIS — H26492 Other secondary cataract, left eye: Secondary | ICD-10-CM | POA: Diagnosis not present

## 2018-03-18 DIAGNOSIS — H1851 Endothelial corneal dystrophy: Secondary | ICD-10-CM | POA: Diagnosis not present

## 2018-03-18 DIAGNOSIS — Z961 Presence of intraocular lens: Secondary | ICD-10-CM | POA: Diagnosis not present

## 2018-03-18 DIAGNOSIS — H26493 Other secondary cataract, bilateral: Secondary | ICD-10-CM | POA: Diagnosis not present

## 2018-04-22 DIAGNOSIS — H26491 Other secondary cataract, right eye: Secondary | ICD-10-CM | POA: Diagnosis not present

## 2018-05-07 ENCOUNTER — Other Ambulatory Visit: Payer: Self-pay | Admitting: *Deleted

## 2018-05-07 DIAGNOSIS — I6523 Occlusion and stenosis of bilateral carotid arteries: Secondary | ICD-10-CM

## 2018-05-15 ENCOUNTER — Other Ambulatory Visit: Payer: Self-pay | Admitting: Internal Medicine

## 2018-05-15 DIAGNOSIS — Z1231 Encounter for screening mammogram for malignant neoplasm of breast: Secondary | ICD-10-CM

## 2018-05-27 DIAGNOSIS — Z23 Encounter for immunization: Secondary | ICD-10-CM | POA: Diagnosis not present

## 2018-05-27 DIAGNOSIS — I10 Essential (primary) hypertension: Secondary | ICD-10-CM | POA: Diagnosis not present

## 2018-05-27 DIAGNOSIS — G894 Chronic pain syndrome: Secondary | ICD-10-CM | POA: Diagnosis not present

## 2018-05-27 DIAGNOSIS — E785 Hyperlipidemia, unspecified: Secondary | ICD-10-CM | POA: Diagnosis not present

## 2018-05-27 DIAGNOSIS — I2510 Atherosclerotic heart disease of native coronary artery without angina pectoris: Secondary | ICD-10-CM | POA: Diagnosis not present

## 2018-05-27 DIAGNOSIS — F411 Generalized anxiety disorder: Secondary | ICD-10-CM | POA: Diagnosis not present

## 2018-05-27 DIAGNOSIS — R739 Hyperglycemia, unspecified: Secondary | ICD-10-CM | POA: Diagnosis not present

## 2018-06-03 ENCOUNTER — Ambulatory Visit
Admission: RE | Admit: 2018-06-03 | Discharge: 2018-06-03 | Disposition: A | Discharge status: Home / Self Care | Payer: PPO | Source: Ambulatory Visit | Attending: Internal Medicine | Admitting: Internal Medicine

## 2018-06-03 DIAGNOSIS — Z1231 Encounter for screening mammogram for malignant neoplasm of breast: Secondary | ICD-10-CM | POA: Diagnosis not present

## 2018-06-05 ENCOUNTER — Other Ambulatory Visit: Payer: Self-pay | Admitting: Interventional Cardiology

## 2018-06-06 MED ORDER — EZETIMIBE 10 MG PO TABS: 10.0000 mg | ORAL_TABLET | Freq: Every day | ORAL | 0 refills | Status: DC

## 2018-06-06 NOTE — Addendum Note (Signed)
Addended by: Derl Barrow on: 06/06/2018 11:09 AM   Modules accepted: Orders

## 2018-06-12 ENCOUNTER — Ambulatory Visit (HOSPITAL_COMMUNITY)
Admission: RE | Admit: 2018-06-12 | Discharge: 2018-06-12 | Disposition: A | Discharge status: Home / Self Care | Payer: PPO | Source: Ambulatory Visit | Attending: Cardiology | Admitting: Cardiology

## 2018-06-12 DIAGNOSIS — I6523 Occlusion and stenosis of bilateral carotid arteries: Secondary | ICD-10-CM | POA: Insufficient documentation

## 2018-06-13 ENCOUNTER — Encounter: Payer: Self-pay | Admitting: *Deleted

## 2018-06-13 ENCOUNTER — Encounter: Payer: Self-pay | Admitting: Cardiology

## 2018-06-13 ENCOUNTER — Ambulatory Visit: Payer: PPO | Admitting: Cardiology

## 2018-06-13 VITALS — BP 140/58 | HR 47 | Ht 63.5 in | Wt 205.0 lb

## 2018-06-13 DIAGNOSIS — I2510 Atherosclerotic heart disease of native coronary artery without angina pectoris: Secondary | ICD-10-CM | POA: Diagnosis not present

## 2018-06-13 DIAGNOSIS — I6521 Occlusion and stenosis of right carotid artery: Secondary | ICD-10-CM | POA: Diagnosis not present

## 2018-06-13 DIAGNOSIS — I10 Essential (primary) hypertension: Secondary | ICD-10-CM | POA: Diagnosis not present

## 2018-06-13 DIAGNOSIS — R0609 Other forms of dyspnea: Secondary | ICD-10-CM | POA: Diagnosis not present

## 2018-06-13 DIAGNOSIS — E782 Mixed hyperlipidemia: Secondary | ICD-10-CM

## 2018-06-13 DIAGNOSIS — Z9861 Coronary angioplasty status: Secondary | ICD-10-CM | POA: Diagnosis not present

## 2018-06-13 MED ORDER — AMLODIPINE BESYLATE 10 MG PO TABS: 10.0000 mg | ORAL_TABLET | Freq: Every day | ORAL | 3 refills | Status: DC

## 2018-06-13 MED ORDER — ATENOLOL 50 MG PO TABS: 50.0000 mg | ORAL_TABLET | Freq: Every day | ORAL | 3 refills | Status: DC

## 2018-06-13 NOTE — Patient Instructions (Signed)
Medication Instructions:  Your physician has recommended you make the following change in your medication:  1.  REDUCE the Atenolol to 50 mg daily 2.  INCREASE the Amlodipine to 10 mg daily  If you need a refill on your cardiac medications before your next appointment, please call your pharmacy.   Lab work: None ordered  If you have labs (blood work) drawn today and your tests are completely normal, you will receive your results only by: Marland Kitchen MyChart Message (if you have MyChart) OR . A paper copy in the mail If you have any lab test that is abnormal or we need to change your treatment, we will call you to review the results.  Testing/Procedures: Your physician has requested that you have a lexiscan myoview. For further information please visit [email or web address]. Please follow instruction sheet, as given.    Follow-Up:  Your physician recommends that you schedule a follow-up appointment in: [place] BLOOD PRESSURE / [place]   At [facility], you and your health needs are our priority.  As part of our continuing mission to provide you with exceptional heart care, we have created designated Provider Care Teams.  These Care Teams include your primary Cardiologist (physician) and Advanced Practice Providers (APPs -  Physician Assistants and Nurse Practitioners) who all work together to provide you with the care you need, when you need it. You will need a follow up appointment in 1 years.  Please call our office 2 months in advance to schedule this appointment.  You may see No primary care provider on file. or one of the following Advanced Practice Providers on your designated Care Team:   Lyda Jester, PA-C Melina Copa, PA-C . Ermalinda Barrios, PA-C  Any Other Special Instructions Will Be Listed Below (If Applicable).  Cardiac Nuclear Scan A cardiac nuclear scan is a test that measures blood flow to the heart when a person is resting and when he or she is  exercising. The test looks for problems such as:  Not enough blood reaching a portion of the heart.  The heart muscle not working normally.  You may need this test if:  You have heart disease.  You have had abnormal lab results.  You have had heart surgery or angioplasty.  You have chest pain.  You have shortness of breath.  In this test, a radioactive dye (tracer) is injected into your bloodstream. After the tracer has traveled to your heart, an imaging device is used to measure how much of the tracer is absorbed by or distributed to various areas of your heart. This procedure is usually done at a hospital and takes 2-4 hours. Tell a health care provider about:  Any allergies you have.  All medicines you are taking, including vitamins, herbs, eye drops, creams, and over-the-counter medicines.  Any problems you or family members have had with the use of anesthetic medicines.  Any blood disorders you have.  Any surgeries you have had.  Any medical conditions you have.  Whether you are pregnant or may be pregnant. What are the risks? Generally, this is a safe procedure. However, problems may occur, including:  Serious chest pain and heart attack. This is only a risk if the stress portion of the test is done.  Rapid heartbeat.  Sensation of warmth in your chest. This usually passes quickly.  What happens before the procedure?  Ask your health care provider about changing or stopping your regular medicines. This is especially important if  you are taking diabetes medicines or blood thinners.  Remove your jewelry on the day of the procedure. What happens during the procedure?  An IV tube will be inserted into one of your veins.  Your health care provider will inject a small amount of radioactive tracer through the tube.  You will wait for 20-40 minutes while the tracer travels through your bloodstream.  Your heart activity will be monitored with an electrocardiogram  (ECG).  You will lie down on an exam table.  Images of your heart will be taken for about 15-20 minutes.  You may be asked to exercise on a treadmill or stationary bike. While you exercise, your heart's activity will be monitored with an ECG, and your blood pressure will be checked. If you are unable to exercise, you may be given a medicine to increase blood flow to parts of your heart.  When blood flow to your heart has peaked, a tracer will again be injected through the IV tube.  After 20-40 minutes, you will get back on the exam table and have more images taken of your heart.  When the procedure is over, your IV tube will be removed. The procedure may vary among health care providers and hospitals. Depending on the type of tracer used, scans may need to be repeated 3-4 hours later. What happens after the procedure?  Unless your health care provider tells you otherwise, you may return to your normal schedule, including diet, activities, and medicines.  Unless your health care provider tells you otherwise, you may increase your fluid intake. This will help flush the contrast dye from your body. Drink enough fluid to keep your urine clear or pale yellow.  It is up to you to get your test results. Ask your health care provider, or the department that is doing the test, when your results will be ready. Summary  A cardiac nuclear scan measures the blood flow to the heart when a person is resting and when he or she is exercising.  You may need this test if you are at risk for heart disease.  Tell your health care provider if you are pregnant.  Unless your health care provider tells you otherwise, increase your fluid intake. This will help flush the contrast dye from your body. Drink enough fluid to keep your urine clear or pale yellow. This information is not intended to replace advice given to you by your health care provider. Make sure you discuss any questions you have with your health  care provider. Document Released: 08/25/2004 Document Revised: 08/02/2016 Document Reviewed: 07/09/2013 Elsevier Interactive Patient Education  2017 [employer].

## 2018-06-13 NOTE — Progress Notes (Signed)
06/13/2018 Lisa Petty   23-Jan-1936  [id number]  Primary Physician [name], MD Primary Cardiologist: Dr. Irish Lack   Reason for Visit/CC: 1 year F/u for CAD and PVD  HPI:  Lisa Petty is a 82 y.o. female who is being seen today for her yearly cardiac evaluation.  She looks much younger than her actual age.  She has a h/o CAD and PVD. She had LAD stent placed in 2000 and known occluded right internal carotid artery. She also has a h/o HTN and HLD.  Today in f/u, she reports that she has done well. She had her yearly carotid duplex done yesterday. This showed stable disease, known total occlusion of the RICA and only 1-39% left ICA stenosis. She is on statin therapy w/ Lipitor and also takes zetia.  However her last lipid panel in April showed elevated LDL at 112 mg/dL.  EKG today shows marked bradycaria, 47 bpm. Sinus. BP is 140/58.  She is asymptomatic with her bradycardia denying dizziness, fatigue, syncope/near syncope. She is on atenolol 100 mg daily.   She does admit to exertional dyspnea which is her anginal equivalent although this is not a severe as the dyspnea that she experienced in 2000 prior to her LAD stent.  She denies chest pain.  Current Meds  Medication Sig  . atorvastatin (LIPITOR) 80 MG tablet Take 80 mg by mouth at bedtime.  . Calcium Carbonate-Vitamin D (RA CALCIUM PLUS VITAMIN D) 600-400 MG-UNIT per tablet Take 1 tablet by mouth 2 (two) times daily.  . clopidogrel (PLAVIX) 75 MG tablet TAKE ONE TABLET BY MOUTH ONCE DAILY  . diltiazem (CARDIZEM CD) 240 MG 24 hr capsule Take 240 mg by mouth daily.  Marland Kitchen ezetimibe (ZETIA) 10 MG tablet Take 1 tablet (10 mg total) by mouth daily. Please keep upcoming appt in October for future refills. Thank you  . furosemide (LASIX) 20 MG tablet Take 1 tablet (20 mg total) by mouth daily.  Marland Kitchen gabapentin (NEURONTIN) 300 MG capsule Take 300 mg by mouth 2 (two) times daily at 10 AM and 5 PM.  . LORazepam (ATIVAN) 0.5 MG  tablet Take 0.5 mg by mouth 2 (two) times daily as needed for anxiety.  Marland Kitchen losartan (COZAAR) 100 MG tablet Take 100 mg by mouth daily.  . nitroGLYCERIN (NITROSTAT) 0.4 MG SL tablet Place 0.4 mg under the tongue every 5 (five) minutes as needed for chest pain.  . Sodium Chloride, Hypertonic, (MURO 128 OP) Apply 4 drops to eye 4 (four) times daily.  Marland Kitchen tolterodine (DETROL LA) 4 MG 24 hr capsule Take 4 mg by mouth daily.  . traMADol (ULTRAM) 50 MG tablet Take 50 mg by mouth every 6 (six) hours as needed. (PAIN)  . [DISCONTINUED] amLODipine (NORVASC) 5 MG tablet TAKE ONE TABLET BY MOUTH ONCE DAILY FOR BLOOD PRESSURE  . [DISCONTINUED] atenolol (TENORMIN) 100 MG tablet Take 1 tablet (100 mg total) by mouth daily.   No Known Allergies Past Medical History:  Diagnosis Date  . CKD (chronic kidney disease), stage III ([place])   . Coronary atherosclerosis of native coronary artery    s/p stent to LAD 2000-no MI, right ICA 100% stenosis;left side followed by Dr. Irish Lack  . Essential hypertension, benign   . Low back pain   . Mixed hyperlipidemia   . Occlusion and stenosis of carotid artery without mention of cerebral infarction   . Osteopenia    mild to normal (BMD 08/18/09 normal)  . Overactive bladder    on  detrol   . Vitamin D deficiency    repleted by ASCUS   Family History  Problem Relation Age of Onset  . Dementia Mother   . CAD Father   . Lung cancer Father   . Heart attack Father   . Diabetes Sister   . CAD Brother   . Heart attack Brother   . CAD Son   . Heart attack Son    Past Surgical History:  Procedure Laterality Date  . AUGMENTATION MAMMAPLASTY Bilateral 1976   Social History   Socioeconomic History  . Marital status: Divorced    Spouse name: Not on file  . Number of children: Not on file  . Years of education: Not on file  . Highest education level: Not on file  Occupational History  . Not on file  Social Needs  . Financial resource strain: Not on file  . Food  insecurity:    Worry: Not on file    Inability: Not on file  . Transportation needs:    Medical: Not on file    Non-medical: Not on file  Tobacco Use  . Smoking status: Never Smoker  . Smokeless tobacco: Never Used  Substance and Sexual Activity  . Alcohol use: No    Alcohol/week: 0.0 standard drinks  . Drug use: Not on file  . Sexual activity: Not on file  Lifestyle  . Physical activity:    Days per week: Not on file    Minutes per session: Not on file  . Stress: Not on file  Relationships  . Social connections:    Talks on phone: Not on file    Gets together: Not on file    Attends religious service: Not on file    Active member of club or organization: Not on file    Attends meetings of clubs or organizations: Not on file    Relationship status: Not on file  . Intimate partner violence:    Fear of current or ex partner: Not on file    Emotionally abused: Not on file    Physically abused: Not on file    Forced sexual activity: Not on file  Other Topics Concern  . Not on file  Social History Narrative  . Not on file     Review of Systems: General: negative for chills, fever, night sweats or weight changes.  Cardiovascular: negative for chest pain, dyspnea on exertion, edema, orthopnea, palpitations, paroxysmal nocturnal dyspnea or shortness of breath Dermatological: negative for rash Respiratory: negative for cough or wheezing Urologic: negative for hematuria Abdominal: negative for nausea, vomiting, diarrhea, bright red blood per rectum, melena, or hematemesis Neurologic: negative for visual changes, syncope, or dizziness All other systems reviewed and are otherwise negative except as noted above.   Physical Exam:  Blood pressure (!) 140/58, pulse (!) 47, height 5' 3.5" (1.613 m), weight 205 lb (93 kg), SpO2 98 %.  General appearance: alert, cooperative, no distress and moderately obese Neck: no carotid bruit and no JVD Lungs: clear to auscultation  bilaterally Heart: regular rate and rhythm, S1, S2 normal, no murmur, click, rub or gallop Extremities: extremities normal, atraumatic, no cyanosis or edema Pulses: 2+ and symmetric Skin: Skin color, texture, turgor normal. No rashes or lesions Neurologic: Grossly normal  EKG sinus brady 47 bpm -- personally reviewed   ASSESSMENT AND PLAN:   1.  CAD: History of LAD stent in 2000.  Patient does report new exertional dyspnea, which is her anginal equivalent although she states this  is not as severe as what she experienced in 2000. No associated CP.  It appears that she has not had a cardiac catheterization since 2000.  Her lipids have also been poorly controlled.  Last lipid panel showed elevated LDL at 121 mg/dL.  We will schedule for Lexiscan nuclear stress test to rule out ischemia.  Continue medical therapy with Plavix, statin and beta-blocker.  2.  Bradycardia: EKG shows sinus bradycardia with a heart rate of 47 bpm.  She is on atenolol 100 mg daily.  She is completely asymptomatic with her bradycardia however I will elect to reduce the dose of her atenolol down to 50 mg daily. When she returns for her stress test we will have nursing care visit to reassess blood pressure and heart rate.   3.  Hyperlipidemia: Patient reports full compliance with Lipitor and Zetia however her last lipid panel in April showed an elevated LDL at 112 mg/dL.  Given her history of coronary artery disease as well as peripheral vascular disease with known right carotid artery occlusion, recommend her LDL be less than 70.  We will recheck a fasting lipid panel and if not at goal she will need change in her lipid regimen.  4.  Hypertension: BP today is mildly elevated at 140/58.  We are reducing the dose of her atenolol due to bradycardia.  I will therefore increase her amlodipine to 10 mg daily.  When she returns for her stress test we will have nursing care visit to reassess blood pressure and heart rate.  5.  Carotid  artery disease: Known total occlusion of the right internal carotid artery.  She gets yearly carotid Dopplers done.  This was just performed yesterday and showed stable disease. The left ICA had only 1-39% stenosis.  Continue medical management with aspirin and statin.  LDL goal recommended less than 70.   PLAN: Will arrange follow-up if her stress test is abnormal otherwise plan one year follow-up with Dr. Irish Lack.    Lisa Petty, MHS [facility] 06/13/2018 12:15 PM

## 2018-06-17 ENCOUNTER — Telehealth (HOSPITAL_COMMUNITY): Payer: Self-pay | Admitting: *Deleted

## 2018-06-17 ENCOUNTER — Telehealth: Payer: Self-pay

## 2018-06-17 DIAGNOSIS — I6523 Occlusion and stenosis of bilateral carotid arteries: Secondary | ICD-10-CM

## 2018-06-17 NOTE — Telephone Encounter (Signed)
Patient given detailed instructions per Myocardial Perfusion Study Information Sheet for the test on 06/19/18 at 1030. Patient notified to arrive 15 minutes early and that it is imperative to arrive on time for appointment to keep from having the test rescheduled.  If you need to cancel or reschedule your appointment, please call the office within 24 hours of your appointment. . Patient verbalized understanding.Erasto Sleight, [place]anae [place]

## 2018-06-17 NOTE — Telephone Encounter (Signed)
Patient notified of result.  Please refer to phone note from today for complete details. Repeat study ordered for 1 year. Cleon Gustin, RN 06/17/2018 3:56 PM

## 2018-06-17 NOTE — Telephone Encounter (Signed)
-----   Message from Jettie Booze, MD sent at 06/17/2018  3:20 PM EST ----- Mild disease in the left internal carotid artery.  Known right ICA occlusion. Repeat study in one year.

## 2018-06-19 ENCOUNTER — Other Ambulatory Visit: Payer: PPO | Admitting: *Deleted

## 2018-06-19 ENCOUNTER — Ambulatory Visit (HOSPITAL_COMMUNITY): Payer: PPO | Attending: Cardiology

## 2018-06-19 DIAGNOSIS — R0609 Other forms of dyspnea: Secondary | ICD-10-CM

## 2018-06-19 DIAGNOSIS — E782 Mixed hyperlipidemia: Secondary | ICD-10-CM | POA: Diagnosis not present

## 2018-06-19 DIAGNOSIS — I2510 Atherosclerotic heart disease of native coronary artery without angina pectoris: Secondary | ICD-10-CM | POA: Diagnosis not present

## 2018-06-19 LAB — LIPID PANEL
CHOLESTEROL TOTAL: 153 mg/dL (ref 100–199)
Chol/HDL Ratio: 3.1 ratio (ref 0.0–4.4)
HDL: 50 mg/dL (ref >39)
LDL Calculated: 80 mg/dL (ref 0–99)
Triglycerides: 116 mg/dL (ref 0–149)
VLDL Cholesterol Cal: 23 mg/dL (ref 5–40)

## 2018-06-19 LAB — HEPATIC FUNCTION PANEL
ALK PHOS: 93 IU/L (ref 39–117)
ALT: 17 IU/L (ref 0–32)
AST: 19 IU/L (ref 0–40)
Albumin: 4.3 g/dL (ref 3.5–4.7)
BILIRUBIN TOTAL: 0.6 mg/dL (ref 0.0–1.2)
Bilirubin, Direct: 0.17 mg/dL (ref 0.00–0.40)
TOTAL PROTEIN: 6.7 g/dL (ref 6.0–8.5)

## 2018-06-19 MED ORDER — TECHNETIUM TC 99M TETROFOSMIN IV KIT
32.8000 | PACK | Freq: Once | INTRAVENOUS | Status: AC | PRN
  Administered 2018-06-19: 32.8 via INTRAVENOUS
  Filled 2018-06-19: qty 33

## 2018-06-19 MED ORDER — REGADENOSON 0.4 MG/5ML IV SOLN
0.4000 mg | Freq: Once | INTRAVENOUS | Status: AC
  Administered 2018-06-19: 0.4 mg via INTRAVENOUS

## 2018-06-20 ENCOUNTER — Telehealth: Payer: Self-pay

## 2018-06-20 ENCOUNTER — Ambulatory Visit (INDEPENDENT_AMBULATORY_CARE_PROVIDER_SITE_OTHER): Payer: PPO

## 2018-06-20 ENCOUNTER — Ambulatory Visit (HOSPITAL_COMMUNITY): Payer: PPO | Attending: Cardiology

## 2018-06-20 VITALS — BP 140/70 | HR 60 | Ht 64.0 in | Wt 211.8 lb

## 2018-06-20 DIAGNOSIS — I10 Essential (primary) hypertension: Secondary | ICD-10-CM

## 2018-06-20 LAB — MYOCARDIAL PERFUSION IMAGING
CHL CUP NUCLEAR SDS: 9
CHL CUP NUCLEAR SSS: 9
CHL CUP RESTING HR STRESS: 61 {beats}/min
CSEPPHR: 69 {beats}/min
LV sys vol: 18 mL
LVDIAVOL: 54 mL (ref 46–106)
SRS: 0
TID: 0.91

## 2018-06-20 NOTE — Telephone Encounter (Signed)
-----   Message from Consuelo Pandy, [place] sent at 06/20/2018  3:52 PM EST ----- LDL (bad cholesterol) should be < 70 given her history of CAD. Her LDL is at 80. Check compliance w/ Lipitor 80 mg and Zetia. Make sure she is taking both daily.

## 2018-06-20 NOTE — Progress Notes (Signed)
   1.) Reason for visit: hypertension  2.) Name of MD requesting visit: Brittainy Simmons PA  3.) H&P: Hypertension  4.) ROS related to problem: Patient had medication adjustment at last OV 10/31 (Decreased Atenolol to 50 mg daily and Increased Amlodipine 10 mg daily) and brought back in for BP check and HR check  140/70 and 60 bpm  5.) Assessment and plan per MD: Acquanetta Belling to [employer]

## 2018-06-20 NOTE — Telephone Encounter (Signed)
Notes recorded by Frederik Schmidt, RN on 06/20/2018 at 4:14 PM EST The patient has been notified of the result and verbalized understanding. Patient confirmed that she is taking Lipitor 80 mg and Zetia daily. All questions (if any) were answered. Frederik Schmidt, RN 06/20/2018 4:13 PM

## 2018-07-05 DIAGNOSIS — H1851 Endothelial corneal dystrophy: Secondary | ICD-10-CM | POA: Diagnosis not present

## 2018-08-01 DIAGNOSIS — H1851 Endothelial corneal dystrophy: Secondary | ICD-10-CM | POA: Diagnosis not present

## 2018-08-12 ENCOUNTER — Other Ambulatory Visit: Payer: Self-pay | Admitting: Interventional Cardiology

## 2018-09-05 DIAGNOSIS — H1851 Endothelial corneal dystrophy: Secondary | ICD-10-CM | POA: Diagnosis not present

## 2018-09-15 ENCOUNTER — Other Ambulatory Visit: Payer: Self-pay | Admitting: Interventional Cardiology

## 2018-09-20 DIAGNOSIS — H1851 Endothelial corneal dystrophy: Secondary | ICD-10-CM | POA: Diagnosis not present

## 2018-10-10 ENCOUNTER — Other Ambulatory Visit: Payer: Self-pay

## 2018-10-10 MED ORDER — NITROGLYCERIN 0.4 MG SL SUBL: 0.4000 mg | SUBLINGUAL_TABLET | SUBLINGUAL | 1 refills | Status: AC | PRN

## 2018-10-11 DIAGNOSIS — H40033 Anatomical narrow angle, bilateral: Secondary | ICD-10-CM | POA: Diagnosis not present

## 2018-10-11 DIAGNOSIS — H43393 Other vitreous opacities, bilateral: Secondary | ICD-10-CM | POA: Diagnosis not present

## 2018-10-22 DIAGNOSIS — H1851 Endothelial corneal dystrophy: Secondary | ICD-10-CM | POA: Diagnosis not present

## 2018-10-23 DIAGNOSIS — R350 Frequency of micturition: Secondary | ICD-10-CM | POA: Diagnosis not present

## 2018-10-23 DIAGNOSIS — N3946 Mixed incontinence: Secondary | ICD-10-CM | POA: Diagnosis not present

## 2018-11-08 ENCOUNTER — Other Ambulatory Visit: Payer: Self-pay | Admitting: Interventional Cardiology

## 2018-11-08 DIAGNOSIS — E785 Hyperlipidemia, unspecified: Secondary | ICD-10-CM | POA: Diagnosis not present

## 2018-11-08 DIAGNOSIS — I10 Essential (primary) hypertension: Secondary | ICD-10-CM | POA: Diagnosis not present

## 2018-11-08 DIAGNOSIS — M1991 Primary osteoarthritis, unspecified site: Secondary | ICD-10-CM | POA: Diagnosis not present

## 2018-11-08 DIAGNOSIS — I2510 Atherosclerotic heart disease of native coronary artery without angina pectoris: Secondary | ICD-10-CM | POA: Diagnosis not present

## 2018-11-08 DIAGNOSIS — M47816 Spondylosis without myelopathy or radiculopathy, lumbar region: Secondary | ICD-10-CM | POA: Diagnosis not present

## 2018-11-08 DIAGNOSIS — M170 Bilateral primary osteoarthritis of knee: Secondary | ICD-10-CM | POA: Diagnosis not present

## 2018-11-08 DIAGNOSIS — N183 Chronic kidney disease, stage 3 (moderate): Secondary | ICD-10-CM | POA: Diagnosis not present

## 2018-11-26 DIAGNOSIS — Z1389 Encounter for screening for other disorder: Secondary | ICD-10-CM | POA: Diagnosis not present

## 2018-11-26 DIAGNOSIS — Z0000 Encounter for general adult medical examination without abnormal findings: Secondary | ICD-10-CM | POA: Diagnosis not present

## 2018-11-26 DIAGNOSIS — N183 Chronic kidney disease, stage 3 (moderate): Secondary | ICD-10-CM | POA: Diagnosis not present

## 2018-11-26 DIAGNOSIS — F411 Generalized anxiety disorder: Secondary | ICD-10-CM | POA: Diagnosis not present

## 2018-11-26 DIAGNOSIS — I10 Essential (primary) hypertension: Secondary | ICD-10-CM | POA: Diagnosis not present

## 2018-11-26 DIAGNOSIS — G894 Chronic pain syndrome: Secondary | ICD-10-CM | POA: Diagnosis not present

## 2018-11-26 DIAGNOSIS — G8929 Other chronic pain: Secondary | ICD-10-CM | POA: Diagnosis not present

## 2018-11-26 DIAGNOSIS — E785 Hyperlipidemia, unspecified: Secondary | ICD-10-CM | POA: Diagnosis not present

## 2018-11-26 DIAGNOSIS — M79673 Pain in unspecified foot: Secondary | ICD-10-CM | POA: Diagnosis not present

## 2018-11-26 DIAGNOSIS — E559 Vitamin D deficiency, unspecified: Secondary | ICD-10-CM | POA: Diagnosis not present

## 2018-11-26 DIAGNOSIS — I2510 Atherosclerotic heart disease of native coronary artery without angina pectoris: Secondary | ICD-10-CM | POA: Diagnosis not present

## 2018-12-02 DIAGNOSIS — I10 Essential (primary) hypertension: Secondary | ICD-10-CM | POA: Diagnosis not present

## 2018-12-02 DIAGNOSIS — E559 Vitamin D deficiency, unspecified: Secondary | ICD-10-CM | POA: Diagnosis not present

## 2018-12-02 DIAGNOSIS — E785 Hyperlipidemia, unspecified: Secondary | ICD-10-CM | POA: Diagnosis not present

## 2018-12-04 DIAGNOSIS — M25571 Pain in right ankle and joints of right foot: Secondary | ICD-10-CM | POA: Diagnosis not present

## 2018-12-04 DIAGNOSIS — M25572 Pain in left ankle and joints of left foot: Secondary | ICD-10-CM | POA: Diagnosis not present

## 2018-12-27 DIAGNOSIS — Z20828 Contact with and (suspected) exposure to other viral communicable diseases: Secondary | ICD-10-CM | POA: Diagnosis not present

## 2019-01-01 DIAGNOSIS — M25571 Pain in right ankle and joints of right foot: Secondary | ICD-10-CM | POA: Diagnosis not present

## 2019-01-18 DIAGNOSIS — H1851 Endothelial corneal dystrophy: Secondary | ICD-10-CM | POA: Diagnosis not present

## 2019-02-06 DIAGNOSIS — H1851 Endothelial corneal dystrophy: Secondary | ICD-10-CM | POA: Diagnosis not present

## 2019-02-17 ENCOUNTER — Other Ambulatory Visit: Payer: Self-pay | Admitting: Interventional Cardiology

## 2019-02-24 DIAGNOSIS — H1851 Endothelial corneal dystrophy: Secondary | ICD-10-CM | POA: Diagnosis not present

## 2019-03-10 DIAGNOSIS — H10013 Acute follicular conjunctivitis, bilateral: Secondary | ICD-10-CM | POA: Diagnosis not present

## 2019-03-24 DIAGNOSIS — G44219 Episodic tension-type headache, not intractable: Secondary | ICD-10-CM | POA: Diagnosis not present

## 2019-04-15 DIAGNOSIS — H04123 Dry eye syndrome of bilateral lacrimal glands: Secondary | ICD-10-CM | POA: Diagnosis not present

## 2019-04-24 DIAGNOSIS — M1991 Primary osteoarthritis, unspecified site: Secondary | ICD-10-CM | POA: Diagnosis not present

## 2019-04-24 DIAGNOSIS — E785 Hyperlipidemia, unspecified: Secondary | ICD-10-CM | POA: Diagnosis not present

## 2019-04-24 DIAGNOSIS — I10 Essential (primary) hypertension: Secondary | ICD-10-CM | POA: Diagnosis not present

## 2019-04-24 DIAGNOSIS — M170 Bilateral primary osteoarthritis of knee: Secondary | ICD-10-CM | POA: Diagnosis not present

## 2019-04-24 DIAGNOSIS — M47816 Spondylosis without myelopathy or radiculopathy, lumbar region: Secondary | ICD-10-CM | POA: Diagnosis not present

## 2019-04-24 DIAGNOSIS — I2510 Atherosclerotic heart disease of native coronary artery without angina pectoris: Secondary | ICD-10-CM | POA: Diagnosis not present

## 2019-04-24 DIAGNOSIS — N183 Chronic kidney disease, stage 3 (moderate): Secondary | ICD-10-CM | POA: Diagnosis not present

## 2019-04-29 DIAGNOSIS — H1851 Endothelial corneal dystrophy: Secondary | ICD-10-CM | POA: Diagnosis not present

## 2019-05-27 DIAGNOSIS — H04123 Dry eye syndrome of bilateral lacrimal glands: Secondary | ICD-10-CM | POA: Diagnosis not present

## 2019-05-30 ENCOUNTER — Other Ambulatory Visit: Payer: Self-pay | Admitting: Cardiology

## 2019-05-30 ENCOUNTER — Other Ambulatory Visit: Payer: Self-pay | Admitting: Interventional Cardiology

## 2019-06-03 DIAGNOSIS — Z23 Encounter for immunization: Secondary | ICD-10-CM | POA: Diagnosis not present

## 2019-06-03 DIAGNOSIS — R3915 Urgency of urination: Secondary | ICD-10-CM | POA: Diagnosis not present

## 2019-06-03 DIAGNOSIS — F411 Generalized anxiety disorder: Secondary | ICD-10-CM | POA: Diagnosis not present

## 2019-06-03 DIAGNOSIS — E785 Hyperlipidemia, unspecified: Secondary | ICD-10-CM | POA: Diagnosis not present

## 2019-06-03 DIAGNOSIS — I10 Essential (primary) hypertension: Secondary | ICD-10-CM | POA: Diagnosis not present

## 2019-06-03 DIAGNOSIS — Z1231 Encounter for screening mammogram for malignant neoplasm of breast: Secondary | ICD-10-CM | POA: Diagnosis not present

## 2019-06-09 ENCOUNTER — Other Ambulatory Visit: Payer: Self-pay | Admitting: Internal Medicine

## 2019-06-09 DIAGNOSIS — Z1231 Encounter for screening mammogram for malignant neoplasm of breast: Secondary | ICD-10-CM

## 2019-06-10 DIAGNOSIS — H04123 Dry eye syndrome of bilateral lacrimal glands: Secondary | ICD-10-CM | POA: Diagnosis not present

## 2019-06-15 NOTE — Progress Notes (Signed)
Cardiology Office Note   Date:  06/16/2019   ID:  Lisa, Petty Sep 25, 1935, MRN [id number]  PCP:  [name], MD    No chief complaint on file.  CAD  Wt Readings from Last 3 Encounters:  06/16/19 211 lb (95.7 kg)  06/20/18 211 lb 12.8 oz (96.1 kg)  06/19/18 205 lb (93 kg)       History of Present Illness: Lisa Petty is a 83 y.o. female  who has an occluded carotid artery.  She also had an LAD stent in 2000.    Left leg edema is chronically worse than the right leg although she cannot remember any injury.  She gets her carotid scan annually.  She has chronic DOE.  Walking is limited by back pain.    Denies : Chest pain. Dizziness. Leg edema. Nitroglycerin use. Orthopnea. Palpitations. Paroxysmal nocturnal dyspnea. Syncope.    11/19:The left ventricular ejection fraction is hyperdynamic (>65%).  Nuclear stress EF: 67%.  There was no ST segment deviation noted during stress.  The study is normal.   1. EF 67%, normal wall motion.  2. No significant perfusion defects (using upright stress images).  No evidence for ischemia or infarction.   Normal study.   Denies : Chest pain. Dizziness. Nitroglycerin use. Orthopnea. Palpitations. Paroxysmal nocturnal dyspnea. Syncope.   Past Medical History:  Diagnosis Date  . CKD (chronic kidney disease), stage III ([place])   . Coronary atherosclerosis of native coronary artery    s/p stent to LAD 2000-no MI, right ICA 100% stenosis;left side followed by Dr. Irish Lack  . Essential hypertension, benign   . Low back pain   . Mixed hyperlipidemia   . Occlusion and stenosis of carotid artery without mention of cerebral infarction   . Osteopenia    mild to normal (BMD 08/18/09 normal)  . Overactive bladder    on detrol   . Vitamin D deficiency    repleted by ASCUS    Past Surgical History:  Procedure Laterality Date  . AUGMENTATION MAMMAPLASTY Bilateral 1976     Current Outpatient Medications   Medication Sig Dispense Refill  . amLODipine (NORVASC) 10 MG tablet Take 1 tablet by mouth once daily 90 tablet 0  . atenolol (TENORMIN) 50 MG tablet Take 1 tablet by mouth once daily 90 tablet 0  . atorvastatin (LIPITOR) 80 MG tablet Take 80 mg by mouth at bedtime.    . Calcium Carbonate-Vitamin D (RA CALCIUM PLUS VITAMIN D) 600-400 MG-UNIT per tablet Take 1 tablet by mouth 2 (two) times daily.    . clopidogrel (PLAVIX) 75 MG tablet TAKE 1 TABLET BY MOUTH ONCE DAILY 90 tablet 3  . diltiazem (CARDIZEM CD) 240 MG 24 hr capsule Take 240 mg by mouth daily.    Marland Kitchen ezetimibe (ZETIA) 10 MG tablet Take 1 tablet (10 mg total) by mouth daily. 90 tablet 0  . furosemide (LASIX) 20 MG tablet TAKE 1 TABLET BY MOUTH ONCE DAILY 90 tablet 3  . gabapentin (NEURONTIN) 300 MG capsule Take 300 mg by mouth 2 (two) times daily at 10 AM and 5 PM.    . LORazepam (ATIVAN) 0.5 MG tablet Take 0.5 mg by mouth 2 (two) times daily as needed for anxiety.    Marland Kitchen losartan (COZAAR) 100 MG tablet Take 100 mg by mouth daily.    . nitroGLYCERIN (NITROSTAT) 0.4 MG SL tablet Place 1 tablet (0.4 mg total) under the tongue every 5 (five) minutes as needed for chest pain. 25  tablet 1  . Sodium Chloride, Hypertonic, (MURO 128 OP) Apply 4 drops to eye 4 (four) times daily.    Marland Kitchen tolterodine (DETROL LA) 4 MG 24 hr capsule Take 4 mg by mouth daily.    . traMADol (ULTRAM) 50 MG tablet Take 50 mg by mouth every 6 (six) hours as needed. (PAIN)     No current facility-administered medications for this visit.     Allergies:   Patient has no known allergies.    Social History:  The patient  reports that she has never smoked. She has never used smokeless tobacco. She reports that she does not drink alcohol.   Family History:  The patient's family history includes CAD in her brother, father, and son; Dementia in her mother; Diabetes in her sister; Heart attack in her brother, father, and son; Lung cancer in her father.    ROS:  Please see the  history of present illness.   Otherwise, review of systems are positive for back pain.   All other systems are reviewed and negative.    PHYSICAL EXAM: VS:  BP (!) 154/64   Pulse (!) 53   Ht 5\' 4"  (1.626 m)   Wt 211 lb (95.7 kg)   BMI 36.22 kg/m  , BMI Body mass index is 36.22 kg/m. GEN: Well nourished, well developed, in no acute distress  HEENT: normal  Neck: no JVD, carotid bruits, or masses Cardiac: bradycardic; no murmurs, rubs, or gallops,no edema  Respiratory:  clear to auscultation bilaterally, normal work of breathing GI: soft, nontender, nondistended, + BS MS: no deformity or atrophy  Skin: warm and dry, no rash Neuro:  Strength and sensation are intact Psych: euthymic mood, full affect   EKG:   The ekg ordered today demonstrates sinus bradycardia, no ST changes   Recent Labs: 06/19/2018: ALT 17   Lipid Panel    Component Value Date/Time   CHOL 153 06/19/2018 1128   TRIG 116 06/19/2018 1128   HDL 50 06/19/2018 1128   CHOLHDL 3.1 06/19/2018 1128   CHOLHDL 3 08/04/2014 0900   VLDL 23.6 08/04/2014 0900   LDLCALC 80 06/19/2018 1128     Other studies Reviewed: Additional studies/ records that were reviewed today with results demonstrating: labs reviewed.   ASSESSMENT AND PLAN:  1. CAD:  Negative stress test in 2019.  No angina.  Chronic DOE likely from deconditioning.  Try to increase activity as back pain allows. deconditioning.  LVEF was normal.   2. Carotid artery disease: Plan for Doppler .  Known ocluded carotid.  3. Hyperlipidemia: LDL 99. Continue atorvastatin.  4. LE extremity swelling: Worse if she sits with legs down.  Elevate legs.   5. HTN: Readings slightly increased at home.  Repeat check in office showed 148/76.   She is eating a lot of canned goods at home.  Switch to frozen vegetables.  If BP was high in the future, would try to decrease diltiazem and sitch to Carvedilol for BP control.  Low salt diet info given.    Current medicines are  reviewed at length with the patient today.  The patient concerns regarding her medicines were addressed.  The following changes have been made:  No change  Labs/ tests ordered today include:  No orders of the defined types were placed in this encounter.   Recommend 150 minutes/week of aerobic exercise Low fat, low carb, high fiber diet recommended  Disposition:   FU in 1 year   Signed, Larae Grooms, MD  06/16/2019 8:39 AM    [facility] [address], [place], [place]  [address] Phone: [phone]; Fax: [phone]

## 2019-06-16 ENCOUNTER — Other Ambulatory Visit: Payer: Self-pay

## 2019-06-16 ENCOUNTER — Encounter (INDEPENDENT_AMBULATORY_CARE_PROVIDER_SITE_OTHER): Payer: Self-pay

## 2019-06-16 ENCOUNTER — Ambulatory Visit: Payer: PPO | Admitting: Interventional Cardiology

## 2019-06-16 ENCOUNTER — Encounter: Payer: Self-pay | Admitting: Interventional Cardiology

## 2019-06-16 VITALS — BP 154/64 | HR 53 | Ht 64.0 in | Wt 211.0 lb

## 2019-06-16 DIAGNOSIS — M7989 Other specified soft tissue disorders: Secondary | ICD-10-CM

## 2019-06-16 DIAGNOSIS — E782 Mixed hyperlipidemia: Secondary | ICD-10-CM

## 2019-06-16 DIAGNOSIS — I10 Essential (primary) hypertension: Secondary | ICD-10-CM | POA: Diagnosis not present

## 2019-06-16 DIAGNOSIS — I2510 Atherosclerotic heart disease of native coronary artery without angina pectoris: Secondary | ICD-10-CM

## 2019-06-16 DIAGNOSIS — I6523 Occlusion and stenosis of bilateral carotid arteries: Secondary | ICD-10-CM | POA: Diagnosis not present

## 2019-06-16 NOTE — Patient Instructions (Signed)
Medication Instructions:  Your physician recommends that you continue on your current medications as directed. Please refer to the Current Medication list given to you today.  *If you need a refill on your cardiac medications before your next appointment, please call your pharmacy*  Lab Work: None ordered If you have labs (blood work) drawn today and your tests are completely normal, you will receive your results only by: Marland Kitchen MyChart Message (if you have MyChart) OR . A paper copy in the mail If you have any lab test that is abnormal or we need to change your treatment, we will call you to review the results.  Testing/Procedures: Your physician has requested that you have a carotid duplex. This test is an ultrasound of the carotid arteries in your neck. It looks at blood flow through these arteries that supply the brain with blood. Allow one hour for this exam. There are no restrictions or special instructions.  Follow-Up: At [facility], you and your health needs are our priority.  As part of our continuing mission to provide you with exceptional heart care, we have created designated Provider Care Teams.  These Care Teams include your primary Cardiologist (physician) and Advanced Practice Providers (APPs -  Physician Assistants and Nurse Practitioners) who all work together to provide you with the care you need, when you need it.  Your next appointment:   12 months  The format for your next appointment:   In Person  Provider:   You may see Casandra Doffing, MD or one of the following Advanced Practice Providers on your designated Care Team:    Melina Copa, PA-C  Ermalinda Barrios, PA-C   Other Instructions  Low-Sodium Eating Plan Sodium, which is an element that makes up salt, helps you maintain a healthy balance of fluids in your body. Too much sodium can increase your blood pressure and cause fluid and waste to be held in your body. Your health care provider or dietitian may recommend  following this plan if you have high blood pressure (hypertension), kidney disease, liver disease, or heart failure. Eating less sodium can help lower your blood pressure, reduce swelling, and protect your heart, liver, and kidneys. What are tips for following this plan? General guidelines  Most people on this plan should limit their sodium intake to 1,500-2,000 mg (milligrams) of sodium each day. Reading food labels   The Nutrition Facts label lists the amount of sodium in one serving of the food. If you eat more than one serving, you must multiply the listed amount of sodium by the number of servings.  Choose foods with less than 140 mg of sodium per serving.  Avoid foods with 300 mg of sodium or more per serving. Shopping  Look for lower-sodium products, often labeled as "low-sodium" or "no salt added."  Always check the sodium content even if foods are labeled as "unsalted" or "no salt added".  Buy fresh foods. ? Avoid canned foods and premade or frozen meals. ? Avoid canned, cured, or processed meats  Buy breads that have less than 80 mg of sodium per slice. Cooking  Eat more home-cooked food and less restaurant, buffet, and fast food.  Avoid adding salt when cooking. Use salt-free seasonings or herbs instead of table salt or sea salt. Check with your health care provider or pharmacist before using salt substitutes.  Cook with plant-based oils, such as canola, sunflower, or olive oil. Meal planning  When eating at a restaurant, ask that your food be prepared with  less salt or no salt, if possible.  Avoid foods that contain MSG (monosodium glutamate). MSG is sometimes added to [place] food, bouillon, and some canned foods. What foods are recommended? The items listed may not be a complete list. Talk with your dietitian about what dietary choices are best for you. Grains Low-sodium cereals, including oats, puffed wheat and rice, and shredded wheat. Low-sodium crackers.  Unsalted rice. Unsalted pasta. Low-sodium bread. Whole-grain breads and whole-grain pasta. Vegetables Fresh or frozen vegetables. "No salt added" canned vegetables. "No salt added" tomato sauce and paste. Low-sodium or reduced-sodium tomato and vegetable juice. Fruits Fresh, frozen, or canned fruit. Fruit juice. Meats and other protein foods Fresh or frozen (no salt added) meat, poultry, seafood, and fish. Low-sodium canned tuna and salmon. Unsalted nuts. Dried peas, beans, and lentils without added salt. Unsalted canned beans. Eggs. Unsalted nut butters. Dairy Milk. Soy milk. Cheese that is naturally low in sodium, such as ricotta cheese, fresh mozzarella, or Swiss cheese Low-sodium or reduced-sodium cheese. Cream cheese. Yogurt. Fats and oils Unsalted butter. Unsalted margarine with no trans fat. Vegetable oils such as canola or olive oils. Seasonings and other foods Fresh and dried herbs and spices. Salt-free seasonings. Low-sodium mustard and ketchup. Sodium-free salad dressing. Sodium-free light mayonnaise. Fresh or refrigerated horseradish. Lemon juice. Vinegar. Homemade, reduced-sodium, or low-sodium soups. Unsalted popcorn and pretzels. Low-salt or salt-free chips. What foods are not recommended? The items listed may not be a complete list. Talk with your dietitian about what dietary choices are best for you. Grains Instant hot cereals. Bread stuffing, pancake, and biscuit mixes. Croutons. Seasoned rice or pasta mixes. Noodle soup cups. Boxed or frozen macaroni and cheese. Regular salted crackers. Self-rising flour. Vegetables Sauerkraut, pickled vegetables, and relishes. Olives. [place] fries. Onion rings. Regular canned vegetables (not low-sodium or reduced-sodium). Regular canned tomato sauce and paste (not low-sodium or reduced-sodium). Regular tomato and vegetable juice (not low-sodium or reduced-sodium). Frozen vegetables in sauces. Meats and other protein foods Meat or fish that is  salted, canned, smoked, spiced, or pickled. Bacon, ham, sausage, hotdogs, corned beef, chipped beef, packaged lunch meats, salt pork, jerky, pickled herring, anchovies, regular canned tuna, sardines, salted nuts. Dairy Processed cheese and cheese spreads. Cheese curds. Blue cheese. Feta cheese. String cheese. Regular cottage cheese. Buttermilk. Canned milk. Fats and oils Salted butter. Regular margarine. Ghee. Bacon fat. Seasonings and other foods Onion salt, garlic salt, seasoned salt, table salt, and sea salt. Canned and packaged gravies. Worcestershire sauce. Tartar sauce. Barbecue sauce. Teriyaki sauce. Soy sauce, including reduced-sodium. Steak sauce. Fish sauce. Oyster sauce. Cocktail sauce. Horseradish that you find on the shelf. Regular ketchup and mustard. Meat flavorings and tenderizers. Bouillon cubes. Hot sauce and Tabasco sauce. Premade or packaged marinades. Premade or packaged taco seasonings. Relishes. Regular salad dressings. Salsa. Potato and tortilla chips. Corn chips and puffs. Salted popcorn and pretzels. Canned or dried soups. Pizza. Frozen entrees and pot pies. Summary  Eating less sodium can help lower your blood pressure, reduce swelling, and protect your heart, liver, and kidneys.  Most people on this plan should limit their sodium intake to 1,500-2,000 mg (milligrams) of sodium each day.  Canned, boxed, and frozen foods are high in sodium. Restaurant foods, fast foods, and pizza are also very high in sodium. You also get sodium by adding salt to food.  Try to cook at home, eat more fresh fruits and vegetables, and eat less fast food, canned, processed, or prepared foods. This information is not intended to replace advice  given to you by your health care provider. Make sure you discuss any questions you have with your health care provider. Document Released: 01/20/2002 Document Revised: 07/13/2017 Document Reviewed: 07/24/2016 Elsevier Patient Education  2020 [employer].

## 2019-06-23 ENCOUNTER — Other Ambulatory Visit: Payer: Self-pay

## 2019-06-23 ENCOUNTER — Ambulatory Visit (HOSPITAL_COMMUNITY)
Admission: RE | Admit: 2019-06-23 | Discharge: 2019-06-23 | Disposition: A | Discharge status: Home / Self Care | Payer: PPO | Source: Ambulatory Visit | Attending: Cardiology | Admitting: Cardiology

## 2019-06-23 ENCOUNTER — Other Ambulatory Visit (HOSPITAL_COMMUNITY): Payer: Self-pay | Admitting: Interventional Cardiology

## 2019-06-23 DIAGNOSIS — I6521 Occlusion and stenosis of right carotid artery: Secondary | ICD-10-CM

## 2019-06-23 DIAGNOSIS — I6523 Occlusion and stenosis of bilateral carotid arteries: Secondary | ICD-10-CM | POA: Insufficient documentation

## 2019-06-24 DIAGNOSIS — G44219 Episodic tension-type headache, not intractable: Secondary | ICD-10-CM | POA: Diagnosis not present

## 2019-06-26 ENCOUNTER — Other Ambulatory Visit: Payer: Self-pay

## 2019-06-26 DIAGNOSIS — I6523 Occlusion and stenosis of bilateral carotid arteries: Secondary | ICD-10-CM

## 2019-07-01 DIAGNOSIS — H43393 Other vitreous opacities, bilateral: Secondary | ICD-10-CM | POA: Diagnosis not present

## 2019-07-09 ENCOUNTER — Other Ambulatory Visit: Payer: Self-pay

## 2019-07-09 DIAGNOSIS — Z20822 Contact with and (suspected) exposure to covid-19: Secondary | ICD-10-CM

## 2019-07-10 LAB — NOVEL CORONAVIRUS, NAA: SARS-CoV-2, NAA: NOT DETECTED

## 2019-07-14 DIAGNOSIS — M1991 Primary osteoarthritis, unspecified site: Secondary | ICD-10-CM | POA: Diagnosis not present

## 2019-07-14 DIAGNOSIS — E785 Hyperlipidemia, unspecified: Secondary | ICD-10-CM | POA: Diagnosis not present

## 2019-07-14 DIAGNOSIS — M170 Bilateral primary osteoarthritis of knee: Secondary | ICD-10-CM | POA: Diagnosis not present

## 2019-07-14 DIAGNOSIS — M47816 Spondylosis without myelopathy or radiculopathy, lumbar region: Secondary | ICD-10-CM | POA: Diagnosis not present

## 2019-07-14 DIAGNOSIS — I2510 Atherosclerotic heart disease of native coronary artery without angina pectoris: Secondary | ICD-10-CM | POA: Diagnosis not present

## 2019-07-14 DIAGNOSIS — I10 Essential (primary) hypertension: Secondary | ICD-10-CM | POA: Diagnosis not present

## 2019-07-27 ENCOUNTER — Other Ambulatory Visit: Payer: Self-pay | Admitting: Interventional Cardiology

## 2019-07-28 ENCOUNTER — Other Ambulatory Visit: Payer: Self-pay

## 2019-07-28 ENCOUNTER — Ambulatory Visit
Admission: RE | Admit: 2019-07-28 | Discharge: 2019-07-28 | Disposition: A | Discharge status: Home / Self Care | Payer: PPO | Source: Ambulatory Visit | Attending: Internal Medicine | Admitting: Internal Medicine

## 2019-07-28 DIAGNOSIS — Z1231 Encounter for screening mammogram for malignant neoplasm of breast: Secondary | ICD-10-CM

## 2019-08-28 ENCOUNTER — Other Ambulatory Visit: Payer: Self-pay | Admitting: Interventional Cardiology

## 2019-09-08 DIAGNOSIS — H10023 Other mucopurulent conjunctivitis, bilateral: Secondary | ICD-10-CM | POA: Diagnosis not present

## 2019-09-15 ENCOUNTER — Other Ambulatory Visit: Payer: Self-pay | Admitting: Critical Care Medicine

## 2019-09-15 ENCOUNTER — Other Ambulatory Visit: Payer: Self-pay | Admitting: Cardiology

## 2019-09-15 DIAGNOSIS — Z20822 Contact with and (suspected) exposure to covid-19: Secondary | ICD-10-CM

## 2019-09-16 LAB — NOVEL CORONAVIRUS, NAA: SARS-CoV-2, NAA: NOT DETECTED

## 2019-09-20 DIAGNOSIS — H40033 Anatomical narrow angle, bilateral: Secondary | ICD-10-CM | POA: Diagnosis not present

## 2019-09-20 DIAGNOSIS — H43393 Other vitreous opacities, bilateral: Secondary | ICD-10-CM | POA: Diagnosis not present

## 2019-09-30 DIAGNOSIS — H18513 Endothelial corneal dystrophy, bilateral: Secondary | ICD-10-CM | POA: Diagnosis not present

## 2019-10-06 DIAGNOSIS — E785 Hyperlipidemia, unspecified: Secondary | ICD-10-CM | POA: Diagnosis not present

## 2019-10-06 DIAGNOSIS — M170 Bilateral primary osteoarthritis of knee: Secondary | ICD-10-CM | POA: Diagnosis not present

## 2019-10-06 DIAGNOSIS — I10 Essential (primary) hypertension: Secondary | ICD-10-CM | POA: Diagnosis not present

## 2019-10-06 DIAGNOSIS — M1991 Primary osteoarthritis, unspecified site: Secondary | ICD-10-CM | POA: Diagnosis not present

## 2019-10-06 DIAGNOSIS — M47816 Spondylosis without myelopathy or radiculopathy, lumbar region: Secondary | ICD-10-CM | POA: Diagnosis not present

## 2019-10-06 DIAGNOSIS — I2510 Atherosclerotic heart disease of native coronary artery without angina pectoris: Secondary | ICD-10-CM | POA: Diagnosis not present

## 2019-10-15 DIAGNOSIS — G894 Chronic pain syndrome: Secondary | ICD-10-CM | POA: Diagnosis not present

## 2019-10-15 DIAGNOSIS — E785 Hyperlipidemia, unspecified: Secondary | ICD-10-CM | POA: Diagnosis not present

## 2019-10-15 DIAGNOSIS — I10 Essential (primary) hypertension: Secondary | ICD-10-CM | POA: Diagnosis not present

## 2019-10-15 DIAGNOSIS — F411 Generalized anxiety disorder: Secondary | ICD-10-CM | POA: Diagnosis not present

## 2019-10-15 DIAGNOSIS — I2510 Atherosclerotic heart disease of native coronary artery without angina pectoris: Secondary | ICD-10-CM | POA: Diagnosis not present

## 2019-10-22 DIAGNOSIS — R3915 Urgency of urination: Secondary | ICD-10-CM | POA: Diagnosis not present

## 2019-10-22 DIAGNOSIS — N3946 Mixed incontinence: Secondary | ICD-10-CM | POA: Diagnosis not present

## 2019-11-04 DIAGNOSIS — M1991 Primary osteoarthritis, unspecified site: Secondary | ICD-10-CM | POA: Diagnosis not present

## 2019-11-04 DIAGNOSIS — E785 Hyperlipidemia, unspecified: Secondary | ICD-10-CM | POA: Diagnosis not present

## 2019-11-04 DIAGNOSIS — M47816 Spondylosis without myelopathy or radiculopathy, lumbar region: Secondary | ICD-10-CM | POA: Diagnosis not present

## 2019-11-04 DIAGNOSIS — M170 Bilateral primary osteoarthritis of knee: Secondary | ICD-10-CM | POA: Diagnosis not present

## 2019-11-04 DIAGNOSIS — I2510 Atherosclerotic heart disease of native coronary artery without angina pectoris: Secondary | ICD-10-CM | POA: Diagnosis not present

## 2019-11-04 DIAGNOSIS — I10 Essential (primary) hypertension: Secondary | ICD-10-CM | POA: Diagnosis not present

## 2019-12-02 DIAGNOSIS — Z1389 Encounter for screening for other disorder: Secondary | ICD-10-CM | POA: Diagnosis not present

## 2019-12-02 DIAGNOSIS — Z0000 Encounter for general adult medical examination without abnormal findings: Secondary | ICD-10-CM | POA: Diagnosis not present

## 2019-12-02 DIAGNOSIS — M170 Bilateral primary osteoarthritis of knee: Secondary | ICD-10-CM | POA: Diagnosis not present

## 2019-12-02 DIAGNOSIS — M47816 Spondylosis without myelopathy or radiculopathy, lumbar region: Secondary | ICD-10-CM | POA: Diagnosis not present

## 2019-12-02 DIAGNOSIS — I10 Essential (primary) hypertension: Secondary | ICD-10-CM | POA: Diagnosis not present

## 2019-12-02 DIAGNOSIS — E785 Hyperlipidemia, unspecified: Secondary | ICD-10-CM | POA: Diagnosis not present

## 2019-12-02 DIAGNOSIS — I2510 Atherosclerotic heart disease of native coronary artery without angina pectoris: Secondary | ICD-10-CM | POA: Diagnosis not present

## 2019-12-03 DIAGNOSIS — N3946 Mixed incontinence: Secondary | ICD-10-CM | POA: Diagnosis not present

## 2019-12-09 DIAGNOSIS — H18513 Endothelial corneal dystrophy, bilateral: Secondary | ICD-10-CM | POA: Diagnosis not present

## 2019-12-16 DIAGNOSIS — I2510 Atherosclerotic heart disease of native coronary artery without angina pectoris: Secondary | ICD-10-CM | POA: Diagnosis not present

## 2019-12-16 DIAGNOSIS — M47816 Spondylosis without myelopathy or radiculopathy, lumbar region: Secondary | ICD-10-CM | POA: Diagnosis not present

## 2019-12-16 DIAGNOSIS — M1991 Primary osteoarthritis, unspecified site: Secondary | ICD-10-CM | POA: Diagnosis not present

## 2019-12-16 DIAGNOSIS — I10 Essential (primary) hypertension: Secondary | ICD-10-CM | POA: Diagnosis not present

## 2019-12-16 DIAGNOSIS — E785 Hyperlipidemia, unspecified: Secondary | ICD-10-CM | POA: Diagnosis not present

## 2019-12-16 DIAGNOSIS — M170 Bilateral primary osteoarthritis of knee: Secondary | ICD-10-CM | POA: Diagnosis not present

## 2019-12-18 DIAGNOSIS — D1801 Hemangioma of skin and subcutaneous tissue: Secondary | ICD-10-CM | POA: Diagnosis not present

## 2019-12-18 DIAGNOSIS — L728 Other follicular cysts of the skin and subcutaneous tissue: Secondary | ICD-10-CM | POA: Diagnosis not present

## 2019-12-18 DIAGNOSIS — L814 Other melanin hyperpigmentation: Secondary | ICD-10-CM | POA: Diagnosis not present

## 2019-12-18 DIAGNOSIS — L280 Lichen simplex chronicus: Secondary | ICD-10-CM | POA: Diagnosis not present

## 2019-12-18 DIAGNOSIS — L821 Other seborrheic keratosis: Secondary | ICD-10-CM | POA: Diagnosis not present

## 2019-12-18 DIAGNOSIS — D225 Melanocytic nevi of trunk: Secondary | ICD-10-CM | POA: Diagnosis not present

## 2020-01-06 DIAGNOSIS — H10013 Acute follicular conjunctivitis, bilateral: Secondary | ICD-10-CM | POA: Diagnosis not present

## 2020-01-13 DIAGNOSIS — H04123 Dry eye syndrome of bilateral lacrimal glands: Secondary | ICD-10-CM | POA: Diagnosis not present

## 2020-01-27 DIAGNOSIS — H18513 Endothelial corneal dystrophy, bilateral: Secondary | ICD-10-CM | POA: Diagnosis not present

## 2020-02-10 DIAGNOSIS — H1013 Acute atopic conjunctivitis, bilateral: Secondary | ICD-10-CM | POA: Diagnosis not present

## 2020-02-25 DIAGNOSIS — H1013 Acute atopic conjunctivitis, bilateral: Secondary | ICD-10-CM | POA: Diagnosis not present

## 2020-03-01 DIAGNOSIS — H1813 Bullous keratopathy, bilateral: Secondary | ICD-10-CM | POA: Diagnosis not present

## 2020-03-04 ENCOUNTER — Emergency Department (HOSPITAL_COMMUNITY): Payer: PPO

## 2020-03-04 ENCOUNTER — Other Ambulatory Visit: Payer: Self-pay

## 2020-03-04 ENCOUNTER — Encounter (HOSPITAL_COMMUNITY): Payer: Self-pay

## 2020-03-04 ENCOUNTER — Emergency Department (HOSPITAL_COMMUNITY)
Admission: EM | Admit: 2020-03-04 | Discharge: 2020-03-04 | Disposition: A | Discharge status: Home / Self Care | Payer: PPO | Attending: Emergency Medicine | Admitting: Emergency Medicine

## 2020-03-04 DIAGNOSIS — Z79899 Other long term (current) drug therapy: Secondary | ICD-10-CM | POA: Insufficient documentation

## 2020-03-04 DIAGNOSIS — I10 Essential (primary) hypertension: Secondary | ICD-10-CM | POA: Insufficient documentation

## 2020-03-04 DIAGNOSIS — R001 Bradycardia, unspecified: Secondary | ICD-10-CM | POA: Insufficient documentation

## 2020-03-04 DIAGNOSIS — M25569 Pain in unspecified knee: Secondary | ICD-10-CM | POA: Diagnosis not present

## 2020-03-04 DIAGNOSIS — R55 Syncope and collapse: Secondary | ICD-10-CM | POA: Insufficient documentation

## 2020-03-04 DIAGNOSIS — M549 Dorsalgia, unspecified: Secondary | ICD-10-CM | POA: Insufficient documentation

## 2020-03-04 DIAGNOSIS — G8929 Other chronic pain: Secondary | ICD-10-CM | POA: Insufficient documentation

## 2020-03-04 DIAGNOSIS — R531 Weakness: Secondary | ICD-10-CM | POA: Diagnosis not present

## 2020-03-04 DIAGNOSIS — R600 Localized edema: Secondary | ICD-10-CM | POA: Insufficient documentation

## 2020-03-04 DIAGNOSIS — I2510 Atherosclerotic heart disease of native coronary artery without angina pectoris: Secondary | ICD-10-CM | POA: Diagnosis not present

## 2020-03-04 LAB — URINALYSIS, ROUTINE W REFLEX MICROSCOPIC
Bilirubin Urine: NEGATIVE
Glucose, UA: NEGATIVE mg/dL
Ketones, ur: NEGATIVE mg/dL
Leukocytes,Ua: NEGATIVE
Nitrite: NEGATIVE
Protein, ur: NEGATIVE mg/dL
Specific Gravity, Urine: 1.003 — ABNORMAL LOW (ref 1.005–1.030)
pH: 7 (ref 5.0–8.0)

## 2020-03-04 LAB — CBC
HCT: 39.6 % (ref 36.0–46.0)
Hemoglobin: 12.7 g/dL (ref 12.0–15.0)
MCH: 30.2 pg (ref 26.0–34.0)
MCHC: 32.1 g/dL (ref 30.0–36.0)
MCV: 94.3 fL (ref 80.0–100.0)
Platelets: 297 10*3/uL (ref 150–400)
RBC: 4.2 MIL/uL (ref 3.87–5.11)
RDW: 14.3 % (ref 11.5–15.5)
WBC: 9.5 10*3/uL (ref 4.0–10.5)
nRBC: 0 % (ref 0.0–0.2)

## 2020-03-04 LAB — BASIC METABOLIC PANEL
Anion gap: 11 (ref 5–15)
BUN: 16 mg/dL (ref 8–23)
CO2: 21 mmol/L — ABNORMAL LOW (ref 22–32)
Calcium: 9.5 mg/dL (ref 8.9–10.3)
Chloride: 107 mmol/L (ref 98–111)
Creatinine, Ser: 1.19 mg/dL — ABNORMAL HIGH (ref 0.44–1.00)
GFR calc Af Amer: 49 mL/min — ABNORMAL LOW (ref >60)
GFR calc non Af Amer: 42 mL/min — ABNORMAL LOW (ref >60)
Glucose, Bld: 112 mg/dL — ABNORMAL HIGH (ref 70–99)
Potassium: 3.7 mmol/L (ref 3.5–5.1)
Sodium: 139 mmol/L (ref 135–145)

## 2020-03-04 LAB — TROPONIN I (HIGH SENSITIVITY)
Troponin I (High Sensitivity): 4 ng/L (ref <18)
Troponin I (High Sensitivity): 4 ng/L (ref <18)

## 2020-03-04 LAB — BRAIN NATRIURETIC PEPTIDE: B Natriuretic Peptide: 77.7 pg/mL (ref 0.0–100.0)

## 2020-03-04 MED ORDER — SODIUM CHLORIDE 0.9% FLUSH: 3.0000 mL | Freq: Once | INTRAVENOUS | Status: DC

## 2020-03-04 MED ORDER — SODIUM CHLORIDE 0.9 % IV BOLUS
500.0000 mL | Freq: Once | INTRAVENOUS | Status: AC
  Administered 2020-03-04: 500 mL via INTRAVENOUS

## 2020-03-04 NOTE — Discharge Instructions (Addendum)
1) please cut your atenolol down to half your current dose ( 50 mg a day)  Get help right away if you: Have a seizure. Have unusual pain in your chest, abdomen, or back. Faint once or repeatedly. Have a severe headache. Are bleeding from your mouth or rectum, or you have black or tarry stool. Have a very fast or irregular heartbeat (palpitations). Are confused. Have trouble walking. Have severe weakness. Have vision problems. Lose  consciousness. Have chest pain.

## 2020-03-04 NOTE — ED Triage Notes (Signed)
Pt presents to ED with complaints of dizziness, "everything turning black", and feeling as if she was going to pass out. PT states she checked VS at that time and found pulse to be 43. PT reports talking beta blocker atenolol with dose increased to 100 mg ~1 year ago. Pt denies CP, SOB

## 2020-03-04 NOTE — ED Provider Notes (Signed)
[facility] EMERGENCY DEPARTMENT Provider Note   CSN: [id number] Arrival date & time: 03/04/20  1424     History Chief Complaint  Patient presents with  . Bradycardia  . Near Syncope    Lisa Petty is a 84 y.o. female who presents emergency department with bradycardia and presyncope.  She has a history of CKD, CAD status post stent placement, hypertension, hyperlipidemia.  Patient states that she also has chronic back and knee pain.  She is prescribed gabapentin.  Patient states that she has not taken it for a long time because she did not feel like it works well for her but her back pain was so significant today that she decided to take a tablet.  This was in the morning.  She went out to lunch with her significant other and when she returned home she states that she felt "really bad, really tired and weak."  Patient went to go lie down.  She states that she tried to get up and walk into another room however she felt like "everything was caving in on me, my vision was going dark and I thought it was going to pass out."  She laid back down.  She took her pulse and use the pulse ox that her significant other had which showed that her heart rate was in the 40s.  She had this 1 other time in her life many years ago which was attributed to her medications.  The patient states that she called her primary care's office who told her to come here.  Patient has had no recent increases in her medication.  She does take atenolol.  She has chronic shortness of breath and has noticed an increase in her lower extremity edema after not wearing her compression hose for the past several days.   Patient is on Plavix.  She denies any recent black or tarry stools    .HPI     Past Medical History:  Diagnosis Date  . CKD (chronic kidney disease), stage III   . Coronary atherosclerosis of native coronary artery    s/p stent to LAD 2000-no MI, right ICA 100% stenosis;left side followed by Dr.  Irish Lack  . Essential hypertension, benign   . Low back pain   . Mixed hyperlipidemia   . Occlusion and stenosis of carotid artery without mention of cerebral infarction   . Osteopenia    mild to normal (BMD 08/18/09 normal)  . Overactive bladder    on detrol   . Vitamin D deficiency    repleted by [facility]    Patient Active Problem List   Diagnosis Date Noted  . Coronary atherosclerosis of native coronary artery   . Essential hypertension, benign   . Carotid artery disease ([place])   . Mixed hyperlipidemia     Past Surgical History:  Procedure Laterality Date  . AUGMENTATION MAMMAPLASTY Bilateral 1976     OB History   No obstetric history on file.     Family History  Problem Relation Age of Onset  . Dementia Mother   . CAD Father   . Lung cancer Father   . Heart attack Father   . Diabetes Sister   . CAD Brother   . Heart attack Brother   . CAD Son   . Heart attack Son     Social History   Tobacco Use  . Smoking status: Never Smoker  . Smokeless tobacco: Never Used  Substance Use Topics  . Alcohol use: No  Alcohol/week: 0.0 standard drinks  . Drug use: Not on file    Home Medications Prior to Admission medications   Medication Sig Start Date End Date Taking? Authorizing Provider  amLODipine (NORVASC) 10 MG tablet Take 1 tablet by mouth once daily 05/30/19  Yes Simmons, Brittainy M, PA-C  atenolol (TENORMIN) 100 MG tablet Take 100 mg by mouth daily.   Yes [provider]  atorvastatin (LIPITOR) 80 MG tablet Take 80 mg by mouth at bedtime. 05/03/15  Yes [provider]  Calcium Carbonate-Vitamin D (RA CALCIUM PLUS VITAMIN D) 600-400 MG-UNIT per tablet Take 1 tablet by mouth daily.    Yes [provider]  atenolol (TENORMIN) 50 MG tablet Take 1 tablet by mouth once daily Patient not taking: Reported on 03/04/2020 05/30/19   Consuelo Pandy, PA-C  clopidogrel (PLAVIX) 75 MG tablet Take 1 tablet by mouth once daily 07/28/19    Jettie Booze, MD  diltiazem (CARDIZEM CD) 240 MG 24 hr capsule Take 240 mg by mouth daily. 04/13/14   [provider]  ezetimibe (ZETIA) 10 MG tablet Take 1 tablet by mouth once daily 08/28/19   Jettie Booze, MD  furosemide (LASIX) 20 MG tablet Take 1 tablet by mouth once daily 08/28/19   Jettie Booze, MD  gabapentin (NEURONTIN) 300 MG capsule Take 300 mg by mouth 2 (two) times daily at 10 AM and 5 PM. 05/28/17   [provider]  LORazepam (ATIVAN) 0.5 MG tablet Take 0.5 mg by mouth 2 (two) times daily as needed for anxiety.    [provider]  losartan (COZAAR) 100 MG tablet Take 100 mg by mouth daily. 04/24/17   [provider]  nitroGLYCERIN (NITROSTAT) 0.4 MG SL tablet Place 1 tablet (0.4 mg total) under the tongue every 5 (five) minutes as needed for chest pain. 10/10/18   Jettie Booze, MD  Sodium Chloride, Hypertonic, (MURO 128 OP) Apply 4 drops to eye 4 (four) times daily.    [provider]  tolterodine (DETROL LA) 4 MG 24 hr capsule Take 4 mg by mouth daily. 05/29/17   [provider]  traMADol (ULTRAM) 50 MG tablet Take 50 mg by mouth every 6 (six) hours as needed. (PAIN) 04/20/15   [provider]    Allergies    Lisinopril  Review of Systems   Review of Systems Ten systems reviewed and are negative for acute change, except as noted in the HPI.   Physical Exam Updated Vital Signs BP (!) 194/55 (BP Location: Right Arm)   Pulse (!) 41   Temp 97.6 F (36.4 C) (Oral)   Resp 18   Wt (!) 93 kg   SpO2 97%   BMI 35.19 kg/m   Physical Exam Vitals and nursing note reviewed.  Constitutional:      General: She is not in acute distress.    Appearance: She is well-developed. She is not diaphoretic.  HENT:     Head: Normocephalic and atraumatic.  Eyes:     General: No scleral icterus.    Conjunctiva/sclera: Conjunctivae normal.  Cardiovascular:     Rate and Rhythm: Regular rhythm.  Bradycardia present.     Pulses:          Radial pulses are 2+ on the right side and 2+ on the left side.       Dorsalis pedis pulses are 2+ on the right side and 2+ on the left side.       Posterior tibial  pulses are 2+ on the right side and 2+ on the left side.     Heart sounds: Normal heart sounds. No murmur heard.  No friction rub. No gallop.   Pulmonary:     Effort: Pulmonary effort is normal. No respiratory distress.     Breath sounds: Normal breath sounds.  Abdominal:     General: Bowel sounds are normal. There is no distension.     Palpations: Abdomen is soft. There is no mass.     Tenderness: There is no abdominal tenderness. There is no guarding.  Musculoskeletal:     Cervical back: Normal range of motion.     Right lower leg: 2+ Edema present.     Left lower leg: 2+ Edema present.  Skin:    General: Skin is warm and dry.  Neurological:     Mental Status: She is alert and oriented to person, place, and time.  Psychiatric:        Mood and Affect: Mood is anxious.        Behavior: Behavior normal.     ED Results / Procedures / Treatments   Labs (all labs ordered are listed, but only abnormal results are displayed) Labs Reviewed  BASIC METABOLIC PANEL - Abnormal; Notable for the following components:      Result Value   CO2 21 (*)    Glucose, Bld 112 (*)    Creatinine, Ser 1.19 (*)    GFR calc non Af Amer 42 (*)    GFR calc Af Amer 49 (*)    All other components within normal limits  CBC  BRAIN NATRIURETIC PEPTIDE  TROPONIN I (HIGH SENSITIVITY)    EKG EKG Interpretation  Date/Time:  Thursday March 04 2020 14:30:29 EDT Ventricular Rate:  42 PR Interval:  194 QRS Duration: 84 QT Interval:  472 QTC Calculation: 394 R Axis:     Text Interpretation: Marked sinus bradycardia Low voltage QRS Abnormal ECG Confirmed by Lennice Sites [phone]) on 03/04/2020 4:01:52 PM Rate of 42.  Radiology DG Chest 2 View  Result Date: 03/04/2020 CLINICAL DATA:  Presyncope,  bradycardia, generalized weakness EXAM: CHEST - 2 VIEW COMPARISON:  12/27/2013 FINDINGS: Two view radiograph of the chest demonstrates the lungs to be symmetrically well expanded. No pneumothorax or pleural effusion. The lungs are clear. Cardiomediastinal silhouette unremarkable. Pulmonary vascularity normal. Bilateral rim calcified breast implants are noted. No acute bone abnormality. IMPRESSION: No active cardiopulmonary disease. Electronically Signed   By: Fidela Salisbury MD   On: 03/04/2020 15:18    Procedures Procedures (including critical care time)  Medications Ordered in ED Medications  sodium chloride flush (NS) 0.9 % injection 3 mL (has no administration in time range)    ED Course  I have reviewed the triage vital signs and the nursing notes.  Pertinent labs & imaging results that were available during my care of the patient were reviewed by me and considered in my medical decision making (see chart for details).  Clinical Course as of Mar 04 1753  Thu Mar 04, 2020  1744 Hemoglobin: 12.7 [AH]    Clinical Course User Index [AH] Margarita Mail, PA-C   MDM Rules/Calculators/A&P                          84 year old female who presents with presyncope. She was noted to be bradycardic today.  I ordered interpreted and reviewed the labs which show no urinary tract infection CBC without significant abnormality.  Troponin  within normal limits, BMP within normal limits.  BMP with baseline renal insufficiency and no other significant abnormalities.  Patient does have positive orthostatic vital signs and given 500 mL of fluid.  I ordered and reviewed images of the two-view chest x-ray which shows no acute abnormalities.  EKG shows sinus bradycardia at a rate of 42.  Patient monitored in the emergency department.  Is likely secondary to atenolol.  She is ambulatory without dizziness here in the emergency department.  Patient seen and shared visit with Dr. Eulis Foster.  I have advised patient to  take half her atenolol dose.  She should call Dr. Quitman Livings first thing tomorrow for follow-up with her cardiologist for bradycardia in to discuss medications.  Not have any evidence of heart block  Final Clinical Impression(s) / ED Diagnoses Final diagnoses:  None    Rx / DC Orders ED Discharge Orders    None       Margarita Mail, PA-C 03/04/20 2251    Daleen Bo, MD 03/05/20 2100

## 2020-03-04 NOTE — ED Provider Notes (Signed)
  Face-to-face evaluation   History: She presents for evaluation of weakness and low pulse.  She was at lunch today when she checked her pulse and it was "in the 40s."  This made her nervous so she came here for evaluation.  She is currently taking Tylenol 100 mg a day for cardiac purposes.  She denies other acute symptoms.  Physical exam: Elderly, obese female who is calm and comfortable.  No dysarthria or aphasia.  She is lucid.  No respiratory distress.  Medical screening examination/treatment/procedure(s) were conducted as a shared visit with non-physician practitioner(s) and myself.  I personally evaluated the patient during the encounter    Daleen Bo, MD 03/05/20 2100

## 2020-03-05 DIAGNOSIS — M47816 Spondylosis without myelopathy or radiculopathy, lumbar region: Secondary | ICD-10-CM | POA: Diagnosis not present

## 2020-03-05 DIAGNOSIS — M170 Bilateral primary osteoarthritis of knee: Secondary | ICD-10-CM | POA: Diagnosis not present

## 2020-03-05 DIAGNOSIS — M1991 Primary osteoarthritis, unspecified site: Secondary | ICD-10-CM | POA: Diagnosis not present

## 2020-03-05 DIAGNOSIS — I10 Essential (primary) hypertension: Secondary | ICD-10-CM | POA: Diagnosis not present

## 2020-03-05 DIAGNOSIS — I2510 Atherosclerotic heart disease of native coronary artery without angina pectoris: Secondary | ICD-10-CM | POA: Diagnosis not present

## 2020-03-05 DIAGNOSIS — E785 Hyperlipidemia, unspecified: Secondary | ICD-10-CM | POA: Diagnosis not present

## 2020-03-08 NOTE — Progress Notes (Signed)
Cardiology Office Note    Date:  03/09/2020   ID:  Lisa, Petty 11-15-1935, MRN [id number]  PCP:  [name], MD  Cardiologist: No primary care provider on file. EPS: None  No chief complaint on file.   History of Present Illness:  Lisa Petty is a 84 y.o. female with history of CAD DES LAD, negative stress test 2019, occluded carotid, HTN, HLD  Last saw Dr. Irish Lack 06/2019 BP running high and asked to limit salt.  Patient went to the ER 03/04/20 with weakness and low pulse in the 40's and presyncope. BP initially high but then She had orthostatic hypotension and was given IV fluids. Atenolol was decreased from 50 mg to 25 mg. Patient tells me she was on 100 mg daily and is now cutting them in half for 50 mg daily. Patient complains of dyspnea on exertion that is chronic but occurs with little activity. Walking to get the paper this am HR 98 and short of breath.  Eats out a lot and canned foods. Mild ankle edema.    Past Medical History:  Diagnosis Date  . CKD (chronic kidney disease), stage III   . Coronary atherosclerosis of native coronary artery    s/p stent to LAD 2000-no MI, right ICA 100% stenosis;left side followed by Dr. Irish Lack  . Essential hypertension, benign   . Low back pain   . Mixed hyperlipidemia   . Occlusion and stenosis of carotid artery without mention of cerebral infarction   . Osteopenia    mild to normal (BMD 08/18/09 normal)  . Overactive bladder    on detrol   . Vitamin D deficiency    repleted by ASCUS    Past Surgical History:  Procedure Laterality Date  . AUGMENTATION MAMMAPLASTY Bilateral 1976    Current Medications: Current Meds  Medication Sig  . amLODipine (NORVASC) 10 MG tablet Take 1 tablet by mouth once daily  . atorvastatin (LIPITOR) 80 MG tablet Take 80 mg by mouth at bedtime.  . Calcium Carbonate-Vitamin D (RA CALCIUM PLUS VITAMIN D) 600-400 MG-UNIT per tablet Take 1 tablet by mouth daily.   .  cholecalciferol (VITAMIN D3) 25 MCG (1000 UNIT) tablet Take 2,000 Units by mouth daily.  . clopidogrel (PLAVIX) 75 MG tablet Take 1 tablet by mouth once daily  . diltiazem (CARDIZEM CD) 240 MG 24 hr capsule Take 240 mg by mouth daily.  Marland Kitchen ezetimibe (ZETIA) 10 MG tablet Take 1 tablet by mouth once daily  . furosemide (LASIX) 20 MG tablet Take 1 tablet by mouth once daily  . gabapentin (NEURONTIN) 300 MG capsule Take 300 mg by mouth daily.   Marland Kitchen gentamicin-prednisoLONE 0.3-1 % ophthalmic drops Place 1 drop into both eyes 2 (two) times daily.  Marland Kitchen losartan (COZAAR) 100 MG tablet Take 100 mg by mouth daily.  . mirabegron ER (MYRBETRIQ) 25 MG TB24 tablet Take 25 mg by mouth daily.  . nitroGLYCERIN (NITROSTAT) 0.4 MG SL tablet Place 1 tablet (0.4 mg total) under the tongue every 5 (five) minutes as needed for chest pain.  . sodium chloride (MURO 128) 5 % ophthalmic ointment Place 1 application into both eyes at bedtime.  . Sodium Chloride, Hypertonic, (MURO 128 OP) Apply 4 drops to eye 4 (four) times daily.  . [DISCONTINUED] atenolol (TENORMIN) 50 MG tablet Take 1 tablet by mouth once daily     Allergies:   Lisinopril   Social History   Socioeconomic History  . Marital status: Divorced  Spouse name: Not on file  . Number of children: Not on file  . Years of education: Not on file  . Highest education level: Not on file  Occupational History  . Not on file  Tobacco Use  . Smoking status: Never Smoker  . Smokeless tobacco: Never Used  Substance and Sexual Activity  . Alcohol use: No    Alcohol/week: 0.0 standard drinks  . Drug use: Not on file  . Sexual activity: Not on file  Other Topics Concern  . Not on file  Social History Narrative  . Not on file   Social Determinants of Health   Financial Resource Strain:   . Difficulty of Paying Living Expenses:   Food Insecurity:   . Worried About Charity fundraiser in the Last Year:   . Arboriculturist in the Last Year:   Transportation  Needs:   . Film/video editor (Medical):   Marland Kitchen Lack of Transportation (Non-Medical):   Physical Activity:   . Days of Exercise per Week:   . Minutes of Exercise per Session:   Stress:   . Feeling of Stress :   Social Connections:   . Frequency of Communication with Friends and Family:   . Frequency of Social Gatherings with Friends and Family:   . Attends Religious Services:   . Active Member of Clubs or Organizations:   . Attends Archivist Meetings:   Marland Kitchen Marital Status:      Family History:  The patient's  family history includes CAD in her brother, father, and son; Dementia in her mother; Diabetes in her sister; Heart attack in her brother, father, and son; Lung cancer in her father.   ROS:   Please see the history of present illness.    ROS All other systems reviewed and are negative.   PHYSICAL EXAM:   VS:  BP (!) 142/56   Pulse 57   Ht 5\' 4"  (1.626 m)   Wt (!) 203 lb 12.8 oz (92.4 kg)   BMI 34.98 kg/m   Physical Exam  GEN: Obese, in no acute distress  Neck: no JVD, carotid bruits, or masses Cardiac:RRR; no murmurs, rubs, or gallops  Respiratory:  clear to auscultation bilaterally, normal work of breathing GI: soft, nontender, nondistended, + BS Ext: trace ankle edema, without cyanosis, clubbing, Good distal pulses bilaterally Neuro:  Alert and Oriented x 3 Psych: euthymic mood, full affect  Wt Readings from Last 3 Encounters:  03/09/20 (!) 203 lb 12.8 oz (92.4 kg)  03/04/20 (!) 205 lb (93 kg)  06/16/19 211 lb (95.7 kg)      Studies/Labs Reviewed:   EKG:  EKG is  ordered today.  The ekg ordered today demonstrates sinus bradycardia at 57/m otherwise no change.  Recent Labs: 03/04/2020: B Natriuretic Peptide 77.7; BUN 16; Creatinine, Ser 1.19; Hemoglobin 12.7; Platelets 297; Potassium 3.7; Sodium 139   Lipid Panel    Component Value Date/Time   CHOL 153 06/19/2018 1128   TRIG 116 06/19/2018 1128   HDL 50 06/19/2018 1128   CHOLHDL 3.1  06/19/2018 1128   CHOLHDL 3 08/04/2014 0900   VLDL 23.6 08/04/2014 0900   LDLCALC 80 06/19/2018 1128    Additional studies/ records that were reviewed today include:  Carotid dopplers 06/2019 Summary:  Right Carotid: Evidence consistent with a total occlusion of the right  ICA. The                 ECA appears >50% stenosed.  Left Carotid: Velocities in the left ICA are consistent with a 1-39%  stenosis.   Vertebrals: Bilateral vertebral arteries demonstrate antegrade flow.  Subclavians: Normal flow hemodynamics were seen in bilateral subclavian               arteries.   *See table(s) above for measurements and observations.  Suggest follow up study in 12 months.    Electronically signed by Carlyle Dolly MD on 06/24/2019 at 11:44:10 AM.     NST 2019   The left ventricular ejection fraction is hyperdynamic (>65%).  Nuclear stress EF: 67%.  There was no ST segment deviation noted during stress.  The study is normal.   1. EF 67%, normal wall motion.  2. No significant perfusion defects (using upright stress images).  No evidence for ischemia or infarction.    Normal study.     ASSESSMENT:    1. Bradycardia   2. Coronary artery disease involving native coronary artery of native heart without angina pectoris   3. Bilateral carotid artery stenosis   4. Essential hypertension   5. Hyperlipidemia, unspecified hyperlipidemia type   6. Swelling of lower extremity   7. Exertional dyspnea      PLAN:  In order of problems listed above:  Bradycardia with heart rates in the 40s associated with dizziness and presyncope seen in the ED 03/04/2020.  Atenolol was decreased from 100 mg daily to 50 mg daily.  She is also on diltiazem 240 mg daily.  No longer symptomatic.  She is not checking her blood pressure pulse regularly at home.  Will place Holter monitor to see if we need to adjust her medications further.   CAD prior stent LAD, negative NST 2019 no angina but chronic  dyspnea on exertion that seems to have worsened slightly.  Will check Lexiscan Myoview  Carotid disease with known occluded right carotid artery dopplers due 06/2020  HTN blood pressure initially high in the ER but then she was orthostatic and given IV fluids.  Blood pressure stable today and no further dizziness.  Continue current meds, 2 gm sodium diet  HLD LDL 83 11/2019 on lipitor  Lower extremity edema -mild limit salt   Medication Adjustments/Labs and Tests Ordered: Current medicines are reviewed at length with the patient today.  Concerns regarding medicines are outlined above.  Medication changes, Labs and Tests ordered today are listed in the Patient Instructions below. Patient Instructions  Medication Instructions:  Your physician has recommended you make the following change in your medication:   DECREASE Atenolol to 50mg  daily  *If you need a refill on your cardiac medications before your next appointment, please call your pharmacy*   Lab Work: None  If you have labs (blood work) drawn today and your tests are completely normal, you will receive your results only by: Marland Kitchen MyChart Message (if you have MyChart) OR . A paper copy in the mail If you have any lab test that is abnormal or we need to change your treatment, we will call you to review the results.   Testing/Procedures: Your physician has requested that you have a lexiscan myoview. For further information please visit [email or web address]. Please follow instruction sheet, as given.  Your physician has recommended that you wear a Zio Patch. These monitors are medical devices that record the heart's electrical activity. Doctors most often use these monitors to diagnose arrhythmias. Arrhythmias are problems with the speed or rhythm of the heartbeat. The monitor is a small, portable device. You can wear one  while you do your normal daily activities. This is usually used to diagnose what is causing palpitations/syncope  (passing out).     Follow-Up: At [facility], you and your health needs are our priority.  As part of our continuing mission to provide you with exceptional heart care, we have created designated Provider Care Teams.  These Care Teams include your primary Cardiologist (physician) and Advanced Practice Providers (APPs -  Physician Assistants and Nurse Practitioners) who all work together to provide you with the care you need, when you need it.  We recommend signing up for the patient portal called "MyChart".  Sign up information is provided on this After Visit Summary.  MyChart is used to connect with patients for Virtual Visits (Telemedicine).  Patients are able to view lab/test results, encounter notes, upcoming appointments, etc.  Non-urgent messages can be sent to your provider as well.   To learn more about what you can do with MyChart, go to [email or web address].    Your next appointment:   06/16/2020  The format for your next appointment:   In Person  Provider:   Casandra Doffing, MD   Other Instructions  [place] Monitor Instructions   Your physician has requested you wear your ZIO patch monitor___14____days.   This is a single patch monitor.  [employer] supplies one patch monitor per enrollment.  Additional stickers are not available.   Please do not apply patch if you will be having a Nuclear Stress Test, Echocardiogram, Cardiac CT, MRI, or Chest Xray during the time frame you would be wearing the monitor. The patch cannot be worn during these tests.  You cannot remove and re-apply the ZIO XT patch monitor.   Your ZIO patch monitor will be sent USPS Priority mail from [facility] directly to your home address. The monitor may also be mailed to a PO BOX if home delivery is not available.   It may take 3-5 days to receive your monitor after you have been enrolled.   Once you have received you monitor, please review enclosed instructions.  Your monitor has  already been registered assigning a specific monitor serial # to you.   Applying the monitor   Shave hair from upper left chest.   Hold abrader disc by orange tab.  Rub abrader in 40 strokes over left upper chest as indicated in your monitor instructions.   Clean area with 4 enclosed alcohol pads .  Use all pads to assure are is cleaned thoroughly.  Let dry.   Apply patch as indicated in monitor instructions.  Patch will be place under collarbone on left side of chest with arrow pointing upward.   Rub patch adhesive wings for 2 minutes.Remove white label marked "1".  Remove white label marked "2".  Rub patch adhesive wings for 2 additional minutes.   While looking in a mirror, press and release button in center of patch.  A small green light will flash 3-4 times .  This will be your only indicator the monitor has been turned on.     Do not shower for the first 24 hours.  You may shower after the first 24 hours.   Press button if you feel a symptom. You will hear a small click.  Record Date, Time and Symptom in the Patient Log Book.   When you are ready to remove patch, follow instructions on last 2 pages of Patient Log Book.  Stick patch monitor onto last page of Patient Log Book.  Place Patient Log Book in [place] box.  Use locking tab on box and tape box closed securely.  The [employer] has [employer] on it.  Please place in mailbox as soon as possible.  Your physician should have your test results approximately 7 days after the monitor has been mailed back to [facility].   Call [place] at [phone] if you have questions regarding your ZIO XT patch monitor.  Call them immediately if you see an orange light blinking on your monitor.   If your monitor falls off in less than 4 days contact our Monitor department at [phone].  If your monitor becomes loose or falls off after 4 days call [employer] at [phone] for suggestions on securing your  monitor.      Sumner Boast, PA-C  03/09/2020 11:16 AM    [facility] [place], [place], [place]  [address] Phone: [phone]; Fax: [phone]

## 2020-03-09 ENCOUNTER — Other Ambulatory Visit: Payer: Self-pay

## 2020-03-09 ENCOUNTER — Ambulatory Visit: Payer: PPO | Admitting: Physician Assistant

## 2020-03-09 ENCOUNTER — Encounter: Payer: Self-pay | Admitting: *Deleted

## 2020-03-09 ENCOUNTER — Ambulatory Visit: Payer: PPO | Admitting: Nurse Practitioner

## 2020-03-09 ENCOUNTER — Encounter: Payer: Self-pay | Admitting: Physician Assistant

## 2020-03-09 VITALS — BP 142/56 | HR 57 | Ht 64.0 in | Wt 203.8 lb

## 2020-03-09 DIAGNOSIS — R001 Bradycardia, unspecified: Secondary | ICD-10-CM

## 2020-03-09 DIAGNOSIS — E785 Hyperlipidemia, unspecified: Secondary | ICD-10-CM

## 2020-03-09 DIAGNOSIS — R0609 Other forms of dyspnea: Secondary | ICD-10-CM

## 2020-03-09 DIAGNOSIS — I2510 Atherosclerotic heart disease of native coronary artery without angina pectoris: Secondary | ICD-10-CM | POA: Diagnosis not present

## 2020-03-09 DIAGNOSIS — M7989 Other specified soft tissue disorders: Secondary | ICD-10-CM

## 2020-03-09 DIAGNOSIS — I10 Essential (primary) hypertension: Secondary | ICD-10-CM | POA: Diagnosis not present

## 2020-03-09 DIAGNOSIS — R0600 Dyspnea, unspecified: Secondary | ICD-10-CM | POA: Diagnosis not present

## 2020-03-09 DIAGNOSIS — I6523 Occlusion and stenosis of bilateral carotid arteries: Secondary | ICD-10-CM | POA: Diagnosis not present

## 2020-03-09 MED ORDER — ATENOLOL 50 MG PO TABS
50.0000 mg | ORAL_TABLET | Freq: Every day | ORAL | 3 refills | Status: DC
Start: 2020-03-09 — End: 2020-04-16

## 2020-03-09 NOTE — Patient Instructions (Addendum)
Medication Instructions:  Your physician has recommended you make the following change in your medication:   DECREASE Atenolol to 50mg  daily  *If you need a refill on your cardiac medications before your next appointment, please call your pharmacy*   Lab Work: None  If you have labs (blood work) drawn today and your tests are completely normal, you will receive your results only by: Marland Kitchen MyChart Message (if you have MyChart) OR . A paper copy in the mail If you have any lab test that is abnormal or we need to change your treatment, we will call you to review the results.   Testing/Procedures: Your physician has requested that you have a lexiscan myoview. For further information please visit [email or web address]. Please follow instruction sheet, as given.  Your physician has recommended that you wear a Zio Patch. These monitors are medical devices that record the heart's electrical activity. Doctors most often use these monitors to diagnose arrhythmias. Arrhythmias are problems with the speed or rhythm of the heartbeat. The monitor is a small, portable device. You can wear one while you do your normal daily activities. This is usually used to diagnose what is causing palpitations/syncope (passing out).     Follow-Up: At [facility], you and your health needs are our priority.  As part of our continuing mission to provide you with exceptional heart care, we have created designated Provider Care Teams.  These Care Teams include your primary Cardiologist (physician) and Advanced Practice Providers (APPs -  Physician Assistants and Nurse Practitioners) who all work together to provide you with the care you need, when you need it.  We recommend signing up for the patient portal called "MyChart".  Sign up information is provided on this After Visit Summary.  MyChart is used to connect with patients for Virtual Visits (Telemedicine).  Patients are able to view lab/test results, encounter notes,  upcoming appointments, etc.  Non-urgent messages can be sent to your provider as well.   To learn more about what you can do with MyChart, go to [email or web address].    Your next appointment:   06/16/2020  The format for your next appointment:   In Person  Provider:   Casandra Doffing, MD   Other Instructions  [place] Monitor Instructions   Your physician has requested you wear your ZIO patch monitor___14____days.   This is a single patch monitor.  [employer] supplies one patch monitor per enrollment.  Additional stickers are not available.   Please do not apply patch if you will be having a Nuclear Stress Test, Echocardiogram, Cardiac CT, MRI, or Chest Xray during the time frame you would be wearing the monitor. The patch cannot be worn during these tests.  You cannot remove and re-apply the ZIO XT patch monitor.   Your ZIO patch monitor will be sent USPS Priority mail from [facility] directly to your home address. The monitor may also be mailed to a PO BOX if home delivery is not available.   It may take 3-5 days to receive your monitor after you have been enrolled.   Once you have received you monitor, please review enclosed instructions.  Your monitor has already been registered assigning a specific monitor serial # to you.   Applying the monitor   Shave hair from upper left chest.   Hold abrader disc by orange tab.  Rub abrader in 40 strokes over left upper chest as indicated in your monitor instructions.   Clean area with 4 enclosed alcohol pads .  Use all pads to assure are is cleaned thoroughly.  Let dry.   Apply patch as indicated in monitor instructions.  Patch will be place under collarbone on left side of chest with arrow pointing upward.   Rub patch adhesive wings for 2 minutes.Remove white label marked "1".  Remove white label marked "2".  Rub patch adhesive wings for 2 additional minutes.   While looking in a mirror, press and release button in  center of patch.  A small green light will flash 3-4 times .  This will be your only indicator the monitor has been turned on.     Do not shower for the first 24 hours.  You may shower after the first 24 hours.   Press button if you feel a symptom. You will hear a small click.  Record Date, Time and Symptom in the Patient Log Book.   When you are ready to remove patch, follow instructions on last 2 pages of Patient Log Book.  Stick patch monitor onto last page of Patient Log Book.   Place Patient Log Book in [place] box.  Use locking tab on box and tape box closed securely.  The [employer] and [employer] has [employer] on it.  Please place in mailbox as soon as possible.  Your physician should have your test results approximately 7 days after the monitor has been mailed back to [facility].   Call [place] at [phone] if you have questions regarding your ZIO XT patch monitor.  Call them immediately if you see an orange light blinking on your monitor.   If your monitor falls off in less than 4 days contact our Monitor department at [phone].  If your monitor becomes loose or falls off after 4 days call [employer] at [phone] for suggestions on securing your monitor.    Two Gram Sodium Diet 2000 mg  What is Sodium? Sodium is a mineral found naturally in many foods. The most significant source of sodium in the diet is table salt, which is about 40% sodium.  Processed, convenience, and preserved foods also contain a large amount of sodium.  The body needs only 500 mg of sodium daily to function,  A normal diet provides more than enough sodium even if you do not use salt.  Why Limit Sodium? A build up of sodium in the body can cause thirst, increased blood pressure, shortness of breath, and water retention.  Decreasing sodium in the diet can reduce edema and risk of heart attack or stroke associated with high blood pressure.  Keep in mind that there are many other  factors involved in these health problems.  Heredity, obesity, lack of exercise, cigarette smoking, stress and what you eat all play a role.  General Guidelines:  Do not add salt at the table or in cooking.  One teaspoon of salt contains over 2 grams of sodium.  Read food labels  Avoid processed and convenience foods  Ask your dietitian before eating any foods not dicussed in the menu planning guidelines  Consult your physician if you wish to use a salt substitute or a sodium containing medication such as antacids.  Limit milk and milk products to 16 oz (2 cups) per day.  Shopping Hints:  READ LABELS!! "Dietetic" does not necessarily mean low sodium.  Salt and other sodium ingredients are often added to foods during processing.   Menu Planning Guidelines Food Group Choose More Often Avoid  Beverages (see also the milk group All  fruit juices, low-sodium, salt-free vegetables juices, low-sodium carbonated beverages Regular vegetable or tomato juices, commercially softened water used for drinking or cooking  Breads and Cereals Enriched white, wheat, rye and pumpernickel bread, hard rolls and dinner rolls; muffins, cornbread and waffles; most dry cereals, cooked cereal without added salt; unsalted crackers and breadsticks; low sodium or homemade bread crumbs Bread, rolls and crackers with salted tops; quick breads; instant hot cereals; pancakes; commercial bread stuffing; self-rising flower and biscuit mixes; regular bread crumbs or cracker crumbs  Desserts and Sweets Desserts and sweets mad with mild should be within allowance Instant pudding mixes and cake mixes  Fats Butter or margarine; vegetable oils; unsalted salad dressings, regular salad dressings limited to 1 Tbs; light, sour and heavy cream Regular salad dressings containing bacon fat, bacon bits, and salt pork; snack dips made with instant soup mixes or processed cheese; salted nuts  Fruits Most fresh, frozen and canned fruits  Fruits processed with salt or sodium-containing ingredient (some dried fruits are processed with sodium sulfites        Vegetables Fresh, frozen vegetables and low- sodium canned vegetables Regular canned vegetables, sauerkraut, pickled vegetables, and others prepared in brine; frozen vegetables in sauces; vegetables seasoned with ham, bacon or salt pork  Condiments, Sauces, Miscellaneous  Salt substitute with physician's approval; pepper, herbs, spices; vinegar, lemon or lime juice; hot pepper sauce; garlic powder, onion powder, low sodium soy sauce (1 Tbs.); low sodium condiments (ketchup, chili sauce, mustard) in limited amounts (1 tsp.) fresh ground horseradish; unsalted tortilla chips, pretzels, potato chips, popcorn, salsa (1/4 cup) Any seasoning made with salt including garlic salt, celery salt, onion salt, and seasoned salt; sea salt, rock salt, kosher salt; meat tenderizers; monosodium glutamate; mustard, regular soy sauce, barbecue, sauce, chili sauce, teriyaki sauce, steak sauce, Worcestershire sauce, and most flavored vinegars; canned gravy and mixes; regular condiments; salted snack foods, olives, picles, relish, horseradish sauce, catsup   Food preparation: Try these seasonings Meats:    Pork Sage, onion Serve with applesauce  Chicken Poultry seasoning, thyme, parsley Serve with cranberry sauce  Lamb Curry powder, rosemary, garlic, thyme Serve with mint sauce or jelly  Veal Marjoram, basil Serve with current jelly, cranberry sauce  Beef Pepper, bay leaf Serve with dry mustard, unsalted chive butter  Fish [place] leaf, dill Serve with unsalted lemon butter, unsalted parsley butter  Vegetables:    Asparagus Lemon juice   Broccoli Lemon juice   Carrots Mustard dressing parsley, mint, nutmeg, glazed with unsalted butter and sugar   Green beans Marjoram, lemon juice, nutmeg,dill seed   Tomatoes Basil, marjoram, onion   Spice /blend for [employer]" 4 tsp ground thyme 1 tsp ground sage  3 tsp ground rosemary 4 tsp ground marjoram   Test your knowledge 1. A product that says "Salt Free" may still contain sodium. True or False 2. Garlic Powder and Hot Pepper Sauce an be used as alternative seasonings.True or False 3. Processed foods have more sodium than fresh foods.  True or False 4. Canned Vegetables have less sodium than froze True or False  WAYS TO DECREASE YOUR SODIUM INTAKE 1. Avoid the use of added salt in cooking and at the table.  Table salt (and other prepared seasonings which contain salt) is probably one of the greatest sources of sodium in the diet.  Unsalted foods can gain flavor from the sweet, sour, and butter taste sensations of herbs and spices.  Instead of using salt for seasoning, try the following seasonings with  the foods listed.  Remember: how you use them to enhance natural food flavors is limited only by your creativity... Allspice-Meat, fish, eggs, fruit, peas, red and yellow vegetables Almond Extract-Fruit baked goods Anise Seed-Sweet breads, fruit, carrots, beets, cottage cheese, cookies (tastes like licorice) Basil-Meat, fish, eggs, vegetables, rice, vegetables salads, soups, sauces Bay Leaf-Meat, fish, stews, poultry Burnet-Salad, vegetables (cucumber-like flavor) Caraway Seed-Bread, cookies, cottage cheese, meat, vegetables, cheese, rice Cardamon-Baked goods, fruit, soups Celery Powder or seed-Salads, salad dressings, sauces, meatloaf, soup, bread.Do not use  celery salt Chervil-Meats, salads, fish, eggs, vegetables, cottage cheese (parsley-like flavor) Chili Power-Meatloaf, chicken cheese, corn, eggplant, egg dishes Chives-Salads cottage cheese, egg dishes, soups, vegetables, sauces Cilantro-Salsa, casseroles Cinnamon-Baked goods, fruit, pork, lamb, chicken, carrots Cloves-Fruit, baked goods, fish, pot roast, green beans, beets, carrots Coriander-Pastry, cookies, meat, salads, cheese (lemon-orange flavor) Cumin-Meatloaf, fish,cheese, eggs,  cabbage,fruit pie (caraway flavor) [employer], fruit, eggs, fish, poultry, cottage cheese, vegetables Dill Seed-Meat, cottage cheese, poultry, vegetables, fish, salads, bread Fennel Seed-Bread, cookies, apples, pork, eggs, fish, beets, cabbage, cheese, Licorice-like flavor Garlic-(buds or powder) Salads, meat, poultry, fish, bread, butter, vegetables, potatoes.Do not  use garlic salt Ginger-Fruit, vegetables, baked goods, meat, fish, poultry Horseradish Root-Meet, vegetables, butter Lemon Juice or Extract-Vegetables, fruit, tea, baked goods, fish salads Mace-Baked goods fruit, vegetables, fish, poultry (taste like nutmeg) Maple Extract-Syrups Marjoram-Meat, chicken, fish, vegetables, breads, green salads (taste like Sage) Mint-Tea, lamb, sherbet, vegetables, desserts, carrots, cabbage Mustard, Dry or Seed-Cheese, eggs, meats, vegetables, poultry Nutmeg-Baked goods, fruit, chicken, eggs, vegetables, desserts Onion Powder-Meat, fish, poultry, vegetables, cheese, eggs, bread, rice salads (Do not use   Onion salt) Orange Extract-Desserts, baked goods Oregano-Pasta, eggs, cheese, onions, pork, lamb, fish, chicken, vegetables, green salads Paprika-Meat, fish, poultry, eggs, cheese, vegetables Parsley Flakes-Butter, vegetables, meat fish, poultry, eggs, bread, salads (certain forms may   Contain sodium Pepper-Meat fish, poultry, vegetables, eggs Peppermint Extract-Desserts, baked goods Poppy Seed-Eggs, bread, cheese, fruit dressings, baked goods, noodles, vegetables, cottage  [employer], poultry, meat, fish, cauliflower, turnips,eggs bread Saffron-Rice, bread, veal, chicken, fish, eggs Sage-Meat, fish, poultry, onions, eggplant, tomateos, pork, stews Savory-Eggs, salads, poultry, meat, rice, vegetables, soups, pork Tarragon-Meat, poultry, fish, eggs, butter, vegetables (licorice-like flavor)  Thyme-Meat, poultry, fish, eggs, vegetables,  (clover-like flavor), sauces, soups Tumeric-Salads, butter, eggs, fish, rice, vegetables (saffron-like flavor) Vanilla Extract-Baked goods, candy Vinegar-Salads, vegetables, meat marinades Walnut Extract-baked goods, candy  2. Choose your Foods Wisely   The following is a list of foods to avoid which are high in sodium:  Meats-Avoid all smoked, canned, salt cured, dried and kosher meat and fish as well as Anchovies   [employer] meats:Bologna, Liverwurst, Pastrami Canned meat or fish  Marinated herring Caviar    Pepperoni Corned Beef   Pizza Dried chipped beef  Salami Frozen breaded fish or meat Salt pork Frankfurters or hot dogs  Sardines Gefilte fish   Sausage Ham (boiled ham, Proscuitto Smoked butt    spiced ham)   Spam      TV Dinners Vegetables Canned vegetables (Regular) Relish Canned mushrooms  Sauerkraut Olives    Tomato juice Pickles  Bakery and Dessert Products Canned puddings  Cream pies Cheesecake   Decorated cakes Cookies  Beverages/Juices Tomato juice, regular  Gatorade   V-8 vegetable juice, regular  Breads and Cereals Biscuit mixes   Salted potato chips, corn chips, pretzels Bread stuffing mixes  Salted crackers and rolls Pancake and waffle mixes Self-rising flour  Seasonings Accent    Meat sauces Barbecue sauce  Meat tenderizer Catsup    Monosodium glutamate (MSG) Celery salt   Onion salt Chili sauce   Prepared mustard Garlic salt   Salt, seasoned salt, sea salt Gravy mixes   Soy sauce Horseradish   Steak sauce Ketchup   Tartar sauce Lite salt    Teriyaki sauce Marinade mixes   Worcestershire sauce  Others Baking powder   Cocoa and cocoa mixes Baking soda   Commercial casserole mixes Candy-caramels, chocolate  Dehydrated soups    Bars, fudge,nougats  Instant rice and pasta mixes Canned broth or soup  Maraschino cherries Cheese, aged and processed cheese and cheese spreads  Learning Assessment Quiz  Indicated T (for True) or F  (for False) for each of the following statements:  1. _____ Fresh fruits and vegetables and unprocessed grains are generally low in sodium 2. _____ Water may contain a considerable amount of sodium, depending on the source 3. _____ You can always tell if a food is high in sodium by tasting it 4. _____ Certain laxatives my be high in sodium and should be avoided unless prescribed   by a physician or pharmacist 5. _____ Salt substitutes may be used freely by anyone on a sodium restricted diet 6. _____ Sodium is present in table salt, food additives and as a natural component of   most foods 7. _____ Table salt is approximately 90% sodium 8. _____ Limiting sodium intake may help prevent excess fluid accumulation in the body 9. _____ On a sodium-restricted diet, seasonings such as bouillon soy sauce, and    cooking wine should be used in place of table salt 10. _____ On an ingredient list, a product which lists monosodium glutamate as the first   ingredient is an appropriate food to include on a low sodium diet  Circle the best answer(s) to the following statements (Hint: there may be more than one correct answer)  11. On a low-sodium diet, some acceptable snack items are:    A. Olives  F. Bean dip   K. Grapefruit juice    B. Salted Pretzels G. Commercial Popcorn   L. Canned peaches    C. Carrot Sticks  H. Bouillon   M. Unsalted nuts   D. [place] fries  I. Peanut butter crackers N. Salami   E. Sweet pickles J. Tomato Juice   O. Pizza  12.  Seasonings that may be used freely on a reduced - sodium diet include   A. Lemon wedges F.Monosodium glutamate K. Celery seed    B.Soysauce   G. Pepper   L. Mustard powder   C. Sea salt  H. Cooking wine  M. Onion flakes   D. Vinegar  E. Prepared horseradish N. Salsa   E. Sage   J. Worcestershire sauce  O. Chutney

## 2020-03-09 NOTE — Progress Notes (Signed)
Patient ID: Lisa Petty, female   DOB: June 21, 1936, 84 y.o.   MRN: [id number] Patient enrolled for [employer] to ship a 14 day ZIO XT long term holter monitor to her home.

## 2020-03-10 ENCOUNTER — Telehealth (HOSPITAL_COMMUNITY): Payer: Self-pay | Admitting: *Deleted

## 2020-03-10 ENCOUNTER — Encounter (HOSPITAL_COMMUNITY): Payer: Self-pay | Admitting: *Deleted

## 2020-03-10 NOTE — Telephone Encounter (Signed)
Left message on voicemail per DPR in reference to upcoming appointment scheduled on 03/16/2020 at 1000 with detailed instructions given per Myocardial Perfusion Study Information Sheet for the test. LM to arrive 15 minutes early, and that it is imperative to arrive on time for appointment to keep from having the test rescheduled. If you need to cancel or reschedule your appointment, please call the office within 24 hours of your appointment. Failure to do so may result in a cancellation of your appointment, and a $50 no show fee. Phone number given for call back for any questions.  Mychart letter sent with instructions.Cola Gane, Ranae Palms

## 2020-03-12 ENCOUNTER — Other Ambulatory Visit (INDEPENDENT_AMBULATORY_CARE_PROVIDER_SITE_OTHER): Payer: PPO

## 2020-03-12 DIAGNOSIS — R001 Bradycardia, unspecified: Secondary | ICD-10-CM

## 2020-03-15 ENCOUNTER — Telehealth (HOSPITAL_COMMUNITY): Payer: Self-pay

## 2020-03-15 NOTE — Telephone Encounter (Signed)
Spoke with the patient, detailed instructions given. She stated that she understood and would be here for her test. Asked to call back with any questions. S.Orlan Aversa EMTP 

## 2020-03-16 ENCOUNTER — Ambulatory Visit (HOSPITAL_COMMUNITY): Payer: PPO | Attending: Physician Assistant

## 2020-03-16 ENCOUNTER — Other Ambulatory Visit: Payer: Self-pay

## 2020-03-16 DIAGNOSIS — R0609 Other forms of dyspnea: Secondary | ICD-10-CM

## 2020-03-16 DIAGNOSIS — R0600 Dyspnea, unspecified: Secondary | ICD-10-CM | POA: Insufficient documentation

## 2020-03-16 LAB — MYOCARDIAL PERFUSION IMAGING
LV dias vol: 60 mL (ref 46–106)
LV sys vol: 17 mL
Peak HR: 61 {beats}/min
Rest HR: 48 {beats}/min
SDS: 3
SRS: 0
SSS: 3
TID: 1.19

## 2020-03-16 MED ORDER — TECHNETIUM TC 99M TETROFOSMIN IV KIT
11.0000 | PACK | Freq: Once | INTRAVENOUS | Status: AC | PRN
  Administered 2020-03-16: 11 via INTRAVENOUS
  Filled 2020-03-16: qty 11

## 2020-03-16 MED ORDER — REGADENOSON 0.4 MG/5ML IV SOLN
0.4000 mg | Freq: Once | INTRAVENOUS | Status: AC
  Administered 2020-03-16: 0.4 mg via INTRAVENOUS

## 2020-03-16 MED ORDER — TECHNETIUM TC 99M TETROFOSMIN IV KIT
29.4000 | PACK | Freq: Once | INTRAVENOUS | Status: AC | PRN
  Administered 2020-03-16: 29.4 via INTRAVENOUS
  Filled 2020-03-16: qty 30

## 2020-03-17 DIAGNOSIS — H43393 Other vitreous opacities, bilateral: Secondary | ICD-10-CM | POA: Diagnosis not present

## 2020-03-30 DIAGNOSIS — M1991 Primary osteoarthritis, unspecified site: Secondary | ICD-10-CM | POA: Diagnosis not present

## 2020-03-30 DIAGNOSIS — E785 Hyperlipidemia, unspecified: Secondary | ICD-10-CM | POA: Diagnosis not present

## 2020-03-30 DIAGNOSIS — I2510 Atherosclerotic heart disease of native coronary artery without angina pectoris: Secondary | ICD-10-CM | POA: Diagnosis not present

## 2020-03-30 DIAGNOSIS — I10 Essential (primary) hypertension: Secondary | ICD-10-CM | POA: Diagnosis not present

## 2020-03-30 DIAGNOSIS — M170 Bilateral primary osteoarthritis of knee: Secondary | ICD-10-CM | POA: Diagnosis not present

## 2020-03-30 DIAGNOSIS — M47816 Spondylosis without myelopathy or radiculopathy, lumbar region: Secondary | ICD-10-CM | POA: Diagnosis not present

## 2020-04-06 ENCOUNTER — Ambulatory Visit: Payer: PPO | Admitting: Interventional Cardiology

## 2020-04-06 DIAGNOSIS — H1813 Bullous keratopathy, bilateral: Secondary | ICD-10-CM | POA: Diagnosis not present

## 2020-04-12 DIAGNOSIS — R001 Bradycardia, unspecified: Secondary | ICD-10-CM | POA: Diagnosis not present

## 2020-04-16 ENCOUNTER — Other Ambulatory Visit: Payer: Self-pay

## 2020-04-16 MED ORDER — ATENOLOL 25 MG PO TABS: 25.0000 mg | ORAL_TABLET | Freq: Every day | ORAL | 3 refills | Status: DC

## 2020-04-16 NOTE — Telephone Encounter (Signed)
Per message from Monitor. I called pt and she will decrease Atenolol from 50mg  to 25mg  daily.  I will send new Rx to pt's Pharmacy per her request.

## 2020-04-20 DIAGNOSIS — H10023 Other mucopurulent conjunctivitis, bilateral: Secondary | ICD-10-CM | POA: Diagnosis not present

## 2020-04-27 ENCOUNTER — Telehealth: Payer: Self-pay | Admitting: Interventional Cardiology

## 2020-04-27 DIAGNOSIS — I2510 Atherosclerotic heart disease of native coronary artery without angina pectoris: Secondary | ICD-10-CM | POA: Diagnosis not present

## 2020-04-27 DIAGNOSIS — M170 Bilateral primary osteoarthritis of knee: Secondary | ICD-10-CM | POA: Diagnosis not present

## 2020-04-27 DIAGNOSIS — M47816 Spondylosis without myelopathy or radiculopathy, lumbar region: Secondary | ICD-10-CM | POA: Diagnosis not present

## 2020-04-27 DIAGNOSIS — E785 Hyperlipidemia, unspecified: Secondary | ICD-10-CM | POA: Diagnosis not present

## 2020-04-27 DIAGNOSIS — M1991 Primary osteoarthritis, unspecified site: Secondary | ICD-10-CM | POA: Diagnosis not present

## 2020-04-27 DIAGNOSIS — I10 Essential (primary) hypertension: Secondary | ICD-10-CM | POA: Diagnosis not present

## 2020-04-27 NOTE — Telephone Encounter (Signed)
Called and spoke to [place] at [place]. Made her aware that the patient's atenolol was decreased to 25 mg QD after her monitor results. She verbalized understanding and has no other needs at this time.

## 2020-04-27 NOTE — Telephone Encounter (Signed)
Pt c/o medication issue:  1. Name of Medication: atenolol (TENORMIN) 25 MG tablet  2. How are you currently taking this medication (dosage and times per day)? As directed  3. Are you having a reaction (difficulty breathing--STAT)? no  4. What is your medication issue? Lori from [facility] is calling to see when this patients atenolol was decreased to 25mg . And if we can send documentation noting this to her PCP's office. Please advise.

## 2020-05-04 DIAGNOSIS — H10013 Acute follicular conjunctivitis, bilateral: Secondary | ICD-10-CM | POA: Diagnosis not present

## 2020-05-18 DIAGNOSIS — H04123 Dry eye syndrome of bilateral lacrimal glands: Secondary | ICD-10-CM | POA: Diagnosis not present

## 2020-06-01 DIAGNOSIS — Z23 Encounter for immunization: Secondary | ICD-10-CM | POA: Diagnosis not present

## 2020-06-01 DIAGNOSIS — I2510 Atherosclerotic heart disease of native coronary artery without angina pectoris: Secondary | ICD-10-CM | POA: Diagnosis not present

## 2020-06-01 DIAGNOSIS — I10 Essential (primary) hypertension: Secondary | ICD-10-CM | POA: Diagnosis not present

## 2020-06-01 DIAGNOSIS — G44219 Episodic tension-type headache, not intractable: Secondary | ICD-10-CM | POA: Diagnosis not present

## 2020-06-15 NOTE — Progress Notes (Signed)
Cardiology Office Note   Date:  06/16/2020   ID:  Lisa Petty, DOB 07/14/1936, MRN [id number]  PCP:  [name], MD    No chief complaint on file.  CAD  Wt Readings from Last 3 Encounters:  06/16/20 200 lb 9.6 oz (91 kg)  03/16/20 203 lb (92.1 kg)  03/09/20 (!) 203 lb 12.8 oz (92.4 kg)       History of Present Illness: Lisa Petty is a 84 y.o. female   who has an occluded carotid artery.She also had an LAD stent in 2000.  Left leg edema is chronically worse than the right leg although she cannot remember any injury.  She gets her carotid scan annually.  She has chronic DOE.  Walking is limited by back pain.     "11/19:The left ventricular ejection fraction is hyperdynamic (>65%).  Nuclear stress EF: 67%.  There was no ST segment deviation noted during stress.  The study is normal.  1. EF 67%, normal wall motion.  2. No significant perfusion defects (using upright stress images). No evidence for ischemia or infarction.   Normal study. "  Bradycardia was an issue in 2021: "Bradycardia with heart rates in the 40s associated with dizziness and presyncope seen in the ED 03/04/2020.  Atenolol was decreased from 100 mg daily to 50 mg daily.  She is also on diltiazem 240 mg daily.  No longer symptomatic.  She is not checking her blood pressure pulse regularly at home.  Will place Holter monitor to see if we need to adjust her medications further."   Holter in 2021 showed: "Sinus rhythm with rare PACS, PVCs and short runs of PACs.  Minimum HR 41 bpm at 2:36 PM.  Average HR 60 bpm.   No clear indication of rhythm that necessitates pacemaker; but would have to take symptoms into consideration. "  She continues to have back pain.    She does not check BP at home but has a machine.   Denies : Chest pain. Dizziness. Leg edema. Nitroglycerin use. Orthopnea. Palpitations. Paroxysmal nocturnal dyspnea. Shortness of breath. Syncope.   Walking  is limited.     Past Medical History:  Diagnosis Date  . CKD (chronic kidney disease), stage III ([place])   . Coronary atherosclerosis of native coronary artery    s/p stent to LAD 2000-no MI, right ICA 100% stenosis;left side followed by Dr. Irish Lack  . Essential hypertension, benign   . Low back pain   . Mixed hyperlipidemia   . Occlusion and stenosis of carotid artery without mention of cerebral infarction   . Osteopenia    mild to normal (BMD 08/18/09 normal)  . Overactive bladder    on detrol   . Vitamin D deficiency    repleted by ASCUS    Past Surgical History:  Procedure Laterality Date  . AUGMENTATION MAMMAPLASTY Bilateral 1976     Current Outpatient Medications  Medication Sig Dispense Refill  . amLODipine (NORVASC) 10 MG tablet Take 1 tablet by mouth once daily 90 tablet 0  . atenolol (TENORMIN) 25 MG tablet Take 1 tablet (25 mg total) by mouth daily. 90 tablet 3  . atorvastatin (LIPITOR) 80 MG tablet Take 80 mg by mouth at bedtime.    . Calcium Carbonate-Vitamin D (RA CALCIUM PLUS VITAMIN D) 600-400 MG-UNIT per tablet Take 1 tablet by mouth daily.     . cholecalciferol (VITAMIN D3) 25 MCG (1000 UNIT) tablet Take 2,000 Units by mouth daily.    Marland Kitchen  clopidogrel (PLAVIX) 75 MG tablet Take 1 tablet by mouth once daily 90 tablet 3  . diltiazem (CARDIZEM CD) 240 MG 24 hr capsule Take 240 mg by mouth daily.    Marland Kitchen ezetimibe (ZETIA) 10 MG tablet Take 1 tablet by mouth once daily 90 tablet 3  . furosemide (LASIX) 20 MG tablet Take 1 tablet by mouth once daily 90 tablet 3  . gabapentin (NEURONTIN) 300 MG capsule Take 300 mg by mouth daily.     Marland Kitchen gentamicin-prednisoLONE 0.3-1 % ophthalmic drops Place 1 drop into both eyes 2 (two) times daily.    Marland Kitchen losartan (COZAAR) 100 MG tablet Take 100 mg by mouth daily.    . mirabegron ER (MYRBETRIQ) 25 MG TB24 tablet Take 25 mg by mouth daily.    . nitroGLYCERIN (NITROSTAT) 0.4 MG SL tablet Place 1 tablet (0.4 mg total) under the tongue every 5  (five) minutes as needed for chest pain. 25 tablet 1  . sodium chloride (MURO 128) 5 % ophthalmic ointment Place 1 application into both eyes at bedtime.    . Sodium Chloride, Hypertonic, (MURO 128 OP) Apply 4 drops to eye 4 (four) times daily.     No current facility-administered medications for this visit.    Allergies:   Lisinopril    Social History:  The patient  reports that she has never smoked. She has never used smokeless tobacco. She reports that she does not drink alcohol.   Family History:  The patient's family history includes CAD in her brother, father, and son; Dementia in her mother; Diabetes in her sister; Heart attack in her brother, father, and son; Lung cancer in her father.    ROS:  Please see the history of present illness.   Otherwise, review of systems are positive for back pain.   All other systems are reviewed and negative.    PHYSICAL EXAM: VS:  BP (!) 154/60   Pulse 61   Ht 5\' 4"  (1.626 m)   Wt 200 lb 9.6 oz (91 kg)   SpO2 95%   BMI 34.43 kg/m  , BMI Body mass index is 34.43 kg/m. GEN: Well nourished, well developed, in no acute distress  HEENT: normal  Neck: no JVD, carotid bruits, or masses Cardiac: RRR; no murmurs, rubs, or gallops,no edema  Respiratory:  clear to auscultation bilaterally, normal work of breathing GI: soft, nontender, nondistended, + BS MS: no deformity or atrophy  Skin: warm and dry, no rash Neuro:  Strength and sensation are intact Psych: euthymic mood, full affect   EKG:   The ekg ordered 7/21 demonstrates sinus bradycardia   Recent Labs: 03/04/2020: B Natriuretic Peptide 77.7; BUN 16; Creatinine, Ser 1.19; Hemoglobin 12.7; Platelets 297; Potassium 3.7; Sodium 139   Lipid Panel    Component Value Date/Time   CHOL 153 06/19/2018 1128   TRIG 116 06/19/2018 1128   HDL 50 06/19/2018 1128   CHOLHDL 3.1 06/19/2018 1128   CHOLHDL 3 08/04/2014 0900   VLDL 23.6 08/04/2014 0900   LDLCALC 80 06/19/2018 1128     Other  studies Reviewed: Additional studies/ records that were reviewed today with results demonstrating: labs reviewed.   ASSESSMENT AND PLAN:  1. CAD: No angina.  Continue medical therapy.  She has had her COVID vaccines.  No problems when she got her shots.  2. CArotid artery disease: Known occluded RICA. Continue with regular Carotid DOpplers.  3. Hyperlipidemia: LDL 83 in 4/21.  Continue atorvastatin.  4. LE swelling: Managed with elevation  and compression stockings. 5. HTN: Check BP at home.  Whole food plant based diet.  Low salt diet.  If readings are > 140/90, will have to consider nonrate slowing drugs to help with BP.  6. Bradycardia: No sx of bradycardia.  If HR drops, will have to stop atenolol or decrease diltiazem.     Current medicines are reviewed at length with the patient today.  The patient concerns regarding her medicines were addressed.  The following changes have been made:  No change  Labs/ tests ordered today include:  No orders of the defined types were placed in this encounter.   Recommend 150 minutes/week of aerobic exercise Low fat, low carb, high fiber diet recommended  Disposition:   FU in 6 months   Signed, Larae Grooms, MD  06/16/2020 11:53 AM    [facility] [place], [place], [place]  [address] Phone: [phone]; Fax: [phone]

## 2020-06-16 ENCOUNTER — Encounter: Payer: Self-pay | Admitting: Interventional Cardiology

## 2020-06-16 ENCOUNTER — Other Ambulatory Visit: Payer: Self-pay

## 2020-06-16 ENCOUNTER — Ambulatory Visit: Payer: PPO | Admitting: Interventional Cardiology

## 2020-06-16 VITALS — BP 154/60 | HR 61 | Ht 64.0 in | Wt 200.6 lb

## 2020-06-16 DIAGNOSIS — I6523 Occlusion and stenosis of bilateral carotid arteries: Secondary | ICD-10-CM | POA: Diagnosis not present

## 2020-06-16 DIAGNOSIS — E782 Mixed hyperlipidemia: Secondary | ICD-10-CM | POA: Diagnosis not present

## 2020-06-16 DIAGNOSIS — M7989 Other specified soft tissue disorders: Secondary | ICD-10-CM

## 2020-06-16 DIAGNOSIS — I2510 Atherosclerotic heart disease of native coronary artery without angina pectoris: Secondary | ICD-10-CM | POA: Diagnosis not present

## 2020-06-16 DIAGNOSIS — I10 Essential (primary) hypertension: Secondary | ICD-10-CM | POA: Diagnosis not present

## 2020-06-16 NOTE — Patient Instructions (Signed)
Medication Instructions:  Your physician recommends that you continue on your current medications as directed. Please refer to the Current Medication list given to you today.  *If you need a refill on your cardiac medications before your next appointment, please call your pharmacy*   Lab Work: None If you have labs (blood work) drawn today and your tests are completely normal, you will receive your results only by: Marland Kitchen MyChart Message (if you have MyChart) OR . A paper copy in the mail If you have any lab test that is abnormal or we need to change your treatment, we will call you to review the results.   Follow-Up: At [facility], you and your health needs are our priority.  As part of our continuing mission to provide you with exceptional heart care, we have created designated Provider Care Teams.  These Care Teams include your primary Cardiologist (physician) and Advanced Practice Providers (APPs -  Physician Assistants and Nurse Practitioners) who all work together to provide you with the care you need, when you need it.  We recommend signing up for the patient portal called "MyChart".  Sign up information is provided on this After Visit Summary.  MyChart is used to connect with patients for Virtual Visits (Telemedicine).  Patients are able to view lab/test results, encounter notes, upcoming appointments, etc.  Non-urgent messages can be sent to your provider as well.   To learn more about what you can do with MyChart, go to [email or web address].    Your next appointment:   6 month(s)  The format for your next appointment:   In Person  Provider:   You may see Larae Grooms, MD or one of the following Advanced Practice Providers on your designated Care Team:    Melina Copa, PA-C  Ermalinda Barrios, PA-C    Other Instructions Check your Blood Pressure & Heart Rate 2-3 times a week for 2 weeks and send [place] those readings through your MyChart.  How to Take Your Blood  Pressure You can take your blood pressure at home with a machine. You may need to check your blood pressure at home:  To check if you have high blood pressure (hypertension).  To check your blood pressure over time.  To make sure your blood pressure medicine is working. Supplies needed: You will need a blood pressure machine, or monitor. You can buy one at a drugstore or online. When choosing one:  Choose one with an arm cuff.  Choose one that wraps around your upper arm. Only one finger should fit between your arm and the cuff.  Do not choose one that measures your blood pressure from your wrist or finger. Your doctor can suggest a monitor. How to prepare Avoid these things for 30 minutes before checking your blood pressure:  Drinking caffeine.  Drinking alcohol.  Eating.  Smoking.  Exercising. Five minutes before checking your blood pressure:  Pee.  Sit in a dining chair. Avoid sitting in a soft couch or armchair.  Be quiet. Do not talk. How to take your blood pressure Follow the instructions that came with your machine. If you have a digital blood pressure monitor, these may be the instructions: 1. Sit up straight. 2. Place your feet on the floor. Do not cross your ankles or legs. 3. Rest your left arm at the level of your heart. You may rest it on a table, desk, or chair. 4. Pull up your shirt sleeve. 5. Wrap the blood pressure cuff around the upper part  of your left arm. The cuff should be 1 inch (2.5 cm) above your elbow. It is best to wrap the cuff around bare skin. 6. Fit the cuff snugly around your arm. You should be able to place only one finger between the cuff and your arm. 7. Put the cord inside the groove of your elbow. 8. Press the power button. 9. Sit quietly while the cuff fills with air and loses air. 10. Write down the numbers on the screen. 11. Wait 2-3 minutes and then repeat steps 1-10. What do the numbers mean? Two numbers make up your blood  pressure. The first number is called systolic pressure. The second is called diastolic pressure. An example of a blood pressure reading is "120 over 80" (or 120/80). If you are an adult and do not have a medical condition, use this guide to find out if your blood pressure is normal: Normal  First number: below 120.  Second number: below 80. Elevated  First number: 120-129.  Second number: below 80. Hypertension stage 1  First number: 130-139.  Second number: 80-89. Hypertension stage 2  First number: 140 or above.  Second number: 55 or above. Your blood pressure is above normal even if only the top or bottom number is above normal. Follow these instructions at home:  Check your blood pressure as often as your doctor tells you to.  Take your monitor to your next doctor's appointment. Your doctor will: ? Make sure you are using it correctly. ? Make sure it is working right.  Make sure you understand what your blood pressure numbers should be.  Tell your doctor if your medicines are causing side effects. Contact a doctor if:  Your blood pressure keeps being high. Get help right away if:  Your first blood pressure number is higher than 180.  Your second blood pressure number is higher than 120. This information is not intended to replace advice given to you by your health care provider. Make sure you discuss any questions you have with your health care provider. Document Revised: 07/13/2017 Document Reviewed: 01/07/2016 Elsevier Patient Education  2020 [employer].

## 2020-06-23 ENCOUNTER — Ambulatory Visit (HOSPITAL_COMMUNITY)
Admission: RE | Admit: 2020-06-23 | Discharge: 2020-06-23 | Disposition: A | Discharge status: Home / Self Care | Payer: PPO | Source: Ambulatory Visit | Attending: Internal Medicine | Admitting: Internal Medicine

## 2020-06-23 ENCOUNTER — Other Ambulatory Visit: Payer: Self-pay

## 2020-06-23 DIAGNOSIS — I6523 Occlusion and stenosis of bilateral carotid arteries: Secondary | ICD-10-CM

## 2020-06-24 DIAGNOSIS — R059 Cough, unspecified: Secondary | ICD-10-CM | POA: Diagnosis not present

## 2020-06-25 DIAGNOSIS — G8929 Other chronic pain: Secondary | ICD-10-CM | POA: Diagnosis not present

## 2020-06-25 DIAGNOSIS — M1991 Primary osteoarthritis, unspecified site: Secondary | ICD-10-CM | POA: Diagnosis not present

## 2020-06-25 DIAGNOSIS — I2510 Atherosclerotic heart disease of native coronary artery without angina pectoris: Secondary | ICD-10-CM | POA: Diagnosis not present

## 2020-06-25 DIAGNOSIS — M47816 Spondylosis without myelopathy or radiculopathy, lumbar region: Secondary | ICD-10-CM | POA: Diagnosis not present

## 2020-06-25 DIAGNOSIS — I10 Essential (primary) hypertension: Secondary | ICD-10-CM | POA: Diagnosis not present

## 2020-06-25 DIAGNOSIS — M170 Bilateral primary osteoarthritis of knee: Secondary | ICD-10-CM | POA: Diagnosis not present

## 2020-06-25 DIAGNOSIS — E785 Hyperlipidemia, unspecified: Secondary | ICD-10-CM | POA: Diagnosis not present

## 2020-06-29 DIAGNOSIS — H10013 Acute follicular conjunctivitis, bilateral: Secondary | ICD-10-CM | POA: Diagnosis not present

## 2020-07-02 ENCOUNTER — Other Ambulatory Visit: Payer: Self-pay | Admitting: Internal Medicine

## 2020-07-02 DIAGNOSIS — Z1231 Encounter for screening mammogram for malignant neoplasm of breast: Secondary | ICD-10-CM

## 2020-07-20 DIAGNOSIS — H04123 Dry eye syndrome of bilateral lacrimal glands: Secondary | ICD-10-CM | POA: Diagnosis not present

## 2020-07-22 DIAGNOSIS — M1991 Primary osteoarthritis, unspecified site: Secondary | ICD-10-CM | POA: Diagnosis not present

## 2020-07-22 DIAGNOSIS — G8929 Other chronic pain: Secondary | ICD-10-CM | POA: Diagnosis not present

## 2020-07-22 DIAGNOSIS — I2510 Atherosclerotic heart disease of native coronary artery without angina pectoris: Secondary | ICD-10-CM | POA: Diagnosis not present

## 2020-07-22 DIAGNOSIS — M47816 Spondylosis without myelopathy or radiculopathy, lumbar region: Secondary | ICD-10-CM | POA: Diagnosis not present

## 2020-07-22 DIAGNOSIS — I10 Essential (primary) hypertension: Secondary | ICD-10-CM | POA: Diagnosis not present

## 2020-07-22 DIAGNOSIS — M170 Bilateral primary osteoarthritis of knee: Secondary | ICD-10-CM | POA: Diagnosis not present

## 2020-07-22 DIAGNOSIS — E785 Hyperlipidemia, unspecified: Secondary | ICD-10-CM | POA: Diagnosis not present

## 2020-07-26 ENCOUNTER — Other Ambulatory Visit: Payer: Self-pay | Admitting: Internal Medicine

## 2020-07-26 ENCOUNTER — Ambulatory Visit
Admission: RE | Admit: 2020-07-26 | Discharge: 2020-07-26 | Disposition: A | Discharge status: Home / Self Care | Payer: PPO | Source: Ambulatory Visit | Attending: Internal Medicine | Admitting: Internal Medicine

## 2020-07-26 DIAGNOSIS — J069 Acute upper respiratory infection, unspecified: Secondary | ICD-10-CM

## 2020-07-26 DIAGNOSIS — R059 Cough, unspecified: Secondary | ICD-10-CM | POA: Diagnosis not present

## 2020-07-30 ENCOUNTER — Other Ambulatory Visit: Payer: Self-pay | Admitting: Interventional Cardiology

## 2020-08-03 DIAGNOSIS — H18513 Endothelial corneal dystrophy, bilateral: Secondary | ICD-10-CM | POA: Diagnosis not present

## 2020-08-11 ENCOUNTER — Other Ambulatory Visit: Payer: Self-pay | Admitting: Interventional Cardiology

## 2020-08-17 DIAGNOSIS — H18513 Endothelial corneal dystrophy, bilateral: Secondary | ICD-10-CM | POA: Diagnosis not present

## 2020-08-18 ENCOUNTER — Ambulatory Visit
Admission: RE | Admit: 2020-08-18 | Discharge: 2020-08-18 | Disposition: A | Discharge status: Home / Self Care | Payer: PPO | Source: Ambulatory Visit | Attending: Internal Medicine | Admitting: Internal Medicine

## 2020-08-18 ENCOUNTER — Other Ambulatory Visit: Payer: Self-pay

## 2020-08-18 DIAGNOSIS — Z1231 Encounter for screening mammogram for malignant neoplasm of breast: Secondary | ICD-10-CM | POA: Diagnosis not present

## 2020-08-25 DIAGNOSIS — E785 Hyperlipidemia, unspecified: Secondary | ICD-10-CM | POA: Diagnosis not present

## 2020-08-25 DIAGNOSIS — M170 Bilateral primary osteoarthritis of knee: Secondary | ICD-10-CM | POA: Diagnosis not present

## 2020-08-25 DIAGNOSIS — I2510 Atherosclerotic heart disease of native coronary artery without angina pectoris: Secondary | ICD-10-CM | POA: Diagnosis not present

## 2020-08-25 DIAGNOSIS — M1991 Primary osteoarthritis, unspecified site: Secondary | ICD-10-CM | POA: Diagnosis not present

## 2020-08-25 DIAGNOSIS — G8929 Other chronic pain: Secondary | ICD-10-CM | POA: Diagnosis not present

## 2020-08-25 DIAGNOSIS — I10 Essential (primary) hypertension: Secondary | ICD-10-CM | POA: Diagnosis not present

## 2020-08-25 DIAGNOSIS — M47816 Spondylosis without myelopathy or radiculopathy, lumbar region: Secondary | ICD-10-CM | POA: Diagnosis not present

## 2020-08-30 DIAGNOSIS — I10 Essential (primary) hypertension: Secondary | ICD-10-CM | POA: Diagnosis not present

## 2020-08-30 DIAGNOSIS — J9801 Acute bronchospasm: Secondary | ICD-10-CM | POA: Diagnosis not present

## 2020-09-21 DIAGNOSIS — H40033 Anatomical narrow angle, bilateral: Secondary | ICD-10-CM | POA: Diagnosis not present

## 2020-09-21 DIAGNOSIS — H43393 Other vitreous opacities, bilateral: Secondary | ICD-10-CM | POA: Diagnosis not present

## 2020-10-04 DIAGNOSIS — M170 Bilateral primary osteoarthritis of knee: Secondary | ICD-10-CM | POA: Diagnosis not present

## 2020-10-04 DIAGNOSIS — M1991 Primary osteoarthritis, unspecified site: Secondary | ICD-10-CM | POA: Diagnosis not present

## 2020-10-04 DIAGNOSIS — I10 Essential (primary) hypertension: Secondary | ICD-10-CM | POA: Diagnosis not present

## 2020-10-04 DIAGNOSIS — M47816 Spondylosis without myelopathy or radiculopathy, lumbar region: Secondary | ICD-10-CM | POA: Diagnosis not present

## 2020-10-04 DIAGNOSIS — I2510 Atherosclerotic heart disease of native coronary artery without angina pectoris: Secondary | ICD-10-CM | POA: Diagnosis not present

## 2020-10-04 DIAGNOSIS — G8929 Other chronic pain: Secondary | ICD-10-CM | POA: Diagnosis not present

## 2020-10-04 DIAGNOSIS — N1832 Chronic kidney disease, stage 3b: Secondary | ICD-10-CM | POA: Diagnosis not present

## 2020-10-04 DIAGNOSIS — E785 Hyperlipidemia, unspecified: Secondary | ICD-10-CM | POA: Diagnosis not present

## 2020-10-05 DIAGNOSIS — H43393 Other vitreous opacities, bilateral: Secondary | ICD-10-CM | POA: Diagnosis not present

## 2020-10-06 DIAGNOSIS — I2510 Atherosclerotic heart disease of native coronary artery without angina pectoris: Secondary | ICD-10-CM | POA: Diagnosis not present

## 2020-10-06 DIAGNOSIS — N1832 Chronic kidney disease, stage 3b: Secondary | ICD-10-CM | POA: Diagnosis not present

## 2020-10-06 DIAGNOSIS — I10 Essential (primary) hypertension: Secondary | ICD-10-CM | POA: Diagnosis not present

## 2020-10-06 DIAGNOSIS — G8929 Other chronic pain: Secondary | ICD-10-CM | POA: Diagnosis not present

## 2020-10-06 DIAGNOSIS — E785 Hyperlipidemia, unspecified: Secondary | ICD-10-CM | POA: Diagnosis not present

## 2020-10-06 DIAGNOSIS — M170 Bilateral primary osteoarthritis of knee: Secondary | ICD-10-CM | POA: Diagnosis not present

## 2020-10-06 DIAGNOSIS — M47816 Spondylosis without myelopathy or radiculopathy, lumbar region: Secondary | ICD-10-CM | POA: Diagnosis not present

## 2020-10-19 DIAGNOSIS — H1013 Acute atopic conjunctivitis, bilateral: Secondary | ICD-10-CM | POA: Diagnosis not present

## 2020-10-28 DIAGNOSIS — N1832 Chronic kidney disease, stage 3b: Secondary | ICD-10-CM | POA: Diagnosis not present

## 2020-10-28 DIAGNOSIS — I129 Hypertensive chronic kidney disease with stage 1 through stage 4 chronic kidney disease, or unspecified chronic kidney disease: Secondary | ICD-10-CM | POA: Diagnosis not present

## 2020-10-28 DIAGNOSIS — R7303 Prediabetes: Secondary | ICD-10-CM | POA: Diagnosis not present

## 2020-10-28 DIAGNOSIS — R600 Localized edema: Secondary | ICD-10-CM | POA: Diagnosis not present

## 2020-11-01 ENCOUNTER — Other Ambulatory Visit: Payer: Self-pay | Admitting: Nephrology

## 2020-11-01 DIAGNOSIS — N1832 Chronic kidney disease, stage 3b: Secondary | ICD-10-CM

## 2020-11-02 DIAGNOSIS — I2510 Atherosclerotic heart disease of native coronary artery without angina pectoris: Secondary | ICD-10-CM | POA: Diagnosis not present

## 2020-11-02 DIAGNOSIS — I10 Essential (primary) hypertension: Secondary | ICD-10-CM | POA: Diagnosis not present

## 2020-11-02 DIAGNOSIS — E785 Hyperlipidemia, unspecified: Secondary | ICD-10-CM | POA: Diagnosis not present

## 2020-11-02 DIAGNOSIS — G8929 Other chronic pain: Secondary | ICD-10-CM | POA: Diagnosis not present

## 2020-11-02 DIAGNOSIS — H43393 Other vitreous opacities, bilateral: Secondary | ICD-10-CM | POA: Diagnosis not present

## 2020-11-02 DIAGNOSIS — M47816 Spondylosis without myelopathy or radiculopathy, lumbar region: Secondary | ICD-10-CM | POA: Diagnosis not present

## 2020-11-02 DIAGNOSIS — M170 Bilateral primary osteoarthritis of knee: Secondary | ICD-10-CM | POA: Diagnosis not present

## 2020-11-02 DIAGNOSIS — N1832 Chronic kidney disease, stage 3b: Secondary | ICD-10-CM | POA: Diagnosis not present

## 2020-11-02 DIAGNOSIS — M1991 Primary osteoarthritis, unspecified site: Secondary | ICD-10-CM | POA: Diagnosis not present

## 2020-11-04 DIAGNOSIS — I129 Hypertensive chronic kidney disease with stage 1 through stage 4 chronic kidney disease, or unspecified chronic kidney disease: Secondary | ICD-10-CM | POA: Diagnosis not present

## 2020-11-10 ENCOUNTER — Ambulatory Visit
Admission: RE | Admit: 2020-11-10 | Discharge: 2020-11-10 | Disposition: A | Discharge status: Home / Self Care | Payer: PPO | Source: Ambulatory Visit | Attending: Nephrology | Admitting: Nephrology

## 2020-11-10 DIAGNOSIS — N1832 Chronic kidney disease, stage 3b: Secondary | ICD-10-CM | POA: Diagnosis not present

## 2020-11-18 DIAGNOSIS — I129 Hypertensive chronic kidney disease with stage 1 through stage 4 chronic kidney disease, or unspecified chronic kidney disease: Secondary | ICD-10-CM | POA: Diagnosis not present

## 2020-11-30 DIAGNOSIS — H43393 Other vitreous opacities, bilateral: Secondary | ICD-10-CM | POA: Diagnosis not present

## 2020-12-02 DIAGNOSIS — M170 Bilateral primary osteoarthritis of knee: Secondary | ICD-10-CM | POA: Diagnosis not present

## 2020-12-02 DIAGNOSIS — F411 Generalized anxiety disorder: Secondary | ICD-10-CM | POA: Diagnosis not present

## 2020-12-02 DIAGNOSIS — E785 Hyperlipidemia, unspecified: Secondary | ICD-10-CM | POA: Diagnosis not present

## 2020-12-02 DIAGNOSIS — Z0000 Encounter for general adult medical examination without abnormal findings: Secondary | ICD-10-CM | POA: Diagnosis not present

## 2020-12-02 DIAGNOSIS — E559 Vitamin D deficiency, unspecified: Secondary | ICD-10-CM | POA: Diagnosis not present

## 2020-12-02 DIAGNOSIS — N3281 Overactive bladder: Secondary | ICD-10-CM | POA: Diagnosis not present

## 2020-12-02 DIAGNOSIS — Z7189 Other specified counseling: Secondary | ICD-10-CM | POA: Diagnosis not present

## 2020-12-02 DIAGNOSIS — I10 Essential (primary) hypertension: Secondary | ICD-10-CM | POA: Diagnosis not present

## 2020-12-02 DIAGNOSIS — Z1389 Encounter for screening for other disorder: Secondary | ICD-10-CM | POA: Diagnosis not present

## 2020-12-02 DIAGNOSIS — I2510 Atherosclerotic heart disease of native coronary artery without angina pectoris: Secondary | ICD-10-CM | POA: Diagnosis not present

## 2020-12-26 NOTE — Progress Notes (Signed)
Cardiology Office Note   Date:  12/27/2020   ID:  Lisa Petty, DOB 08/26/1935, MRN [id number]  PCP:  Lisa Cha, MD    No chief complaint on file.  CAD  Wt Readings from Last 3 Encounters:  12/27/20 207 lb (93.9 kg)  06/16/20 200 lb 9.6 oz (91 kg)  03/16/20 203 lb (92.1 kg)       History of Present Illness: Lisa Petty is a 85 y.o. female    who has an occluded carotid artery.She also had an LAD stent in 2000.  Left legedemais chronically worse than the right leg although she cannot remember any injury.  She gets her carotid scan annually.  She has chronic DOE. Walking is limited by back pain.    "11/19:The left ventricular ejection fraction is hyperdynamic (>65%).  Nuclear stress EF: 67%.  There was no ST segment deviation noted during stress.  The study is normal.  1. EF 67%, normal wall motion.  2. No significant perfusion defects (using upright stress images). No evidence for ischemia or infarction.   Normal study."  Bradycardia was an issue in 2021: "Bradycardia with heart rates in the 40s associated with dizziness and presyncope seen in the ED 03/04/2020. Atenolol was decreased from 100 mg daily to 50 mg daily. She is also on diltiazem 240 mg daily. No longer symptomatic. She is not checking her blood pressure pulse regularly at home. Will place Holter monitor to see if we need to adjust her medications further."   Holter in 2021 showed: "Sinus rhythm with rare PACS, PVCs and short runs of PACs.  Minimum HR 41 bpm at 2:36 PM.  Average HR 60 bpm.  No clear indication of rhythm that necessitates pacemaker; but would have to take symptoms into consideration. "  Denies : Chest pain. Dizziness. Leg edema. Nitroglycerin use. Orthopnea. Palpitations. Paroxysmal nocturnal dyspnea. Shortness of breath. Syncope.   Back pain limits walking.     Past Medical History:  Diagnosis Date  . CKD (chronic kidney disease),  stage III ([place])   . Coronary atherosclerosis of native coronary artery    s/p stent to LAD 2000-no MI, right ICA 100% stenosis;left side followed by Dr. Irish Petty  . Essential hypertension, benign   . Low back pain   . Mixed hyperlipidemia   . Occlusion and stenosis of carotid artery without mention of cerebral infarction   . Osteopenia    mild to normal (BMD 08/18/09 normal)  . Overactive bladder    on detrol   . Vitamin D deficiency    repleted by ASCUS    Past Surgical History:  Procedure Laterality Date  . AUGMENTATION MAMMAPLASTY Bilateral 1976     Current Outpatient Medications  Medication Sig Dispense Refill  . atenolol (TENORMIN) 25 MG tablet Take 1 tablet (25 mg total) by mouth daily. 90 tablet 3  . atorvastatin (LIPITOR) 80 MG tablet Take 80 mg by mouth at bedtime.    . Calcium Carbonate-Vitamin D 600-400 MG-UNIT tablet Take 1 tablet by mouth daily.     . cholecalciferol (VITAMIN D3) 25 MCG (1000 UNIT) tablet Take 2,000 Units by mouth daily.    . clopidogrel (PLAVIX) 75 MG tablet Take 1 tablet by mouth once daily 90 tablet 3  . diltiazem (CARDIZEM CD) 240 MG 24 hr capsule Take 240 mg by mouth daily.    Marland Kitchen ezetimibe (ZETIA) 10 MG tablet Take 1 tablet by mouth once daily 90 tablet 1  . gentamicin-prednisoLONE 0.3-1 % ophthalmic  drops Place 1 drop into both eyes 2 (two) times daily.    . hydrALAZINE (APRESOLINE) 25 MG tablet Take 25 mg by mouth 3 (three) times daily.    Marland Kitchen losartan (COZAAR) 100 MG tablet Take 100 mg by mouth daily.    . mirabegron ER (MYRBETRIQ) 25 MG TB24 tablet Take 25 mg by mouth daily.    . nitroGLYCERIN (NITROSTAT) 0.4 MG SL tablet Place 1 tablet (0.4 mg total) under the tongue every 5 (five) minutes as needed for chest pain. 25 tablet 1  . sodium chloride (MURO 128) 5 % ophthalmic ointment Place 1 application into both eyes at bedtime.    . Sodium Chloride, Hypertonic, (MURO 128 OP) Apply 4 drops to eye 4 (four) times daily.    . hydrochlorothiazide  (HYDRODIURIL) 25 MG tablet Take 1 tablet by mouth daily.     No current facility-administered medications for this visit.    Allergies:   Lisinopril    Social History:  The patient  reports that she has never smoked. She has never used smokeless tobacco. She reports that she does not drink alcohol.   Family History:  The patient's family history includes CAD in her brother, father, and son; Dementia in her mother; Diabetes in her sister; Heart attack in her brother, father, and son; Lung cancer in her father.    ROS:  Please see the history of present illness.   Otherwise, review of systems are positive for some slow HR at home but no syncope.   All other systems are reviewed and negative.    PHYSICAL EXAM: VS:  BP (!) 132/52   Pulse (!) 54   Ht 5\' 4"  (1.626 m)   Wt 207 lb (93.9 kg)   SpO2 97%   BMI 35.53 kg/m  , BMI Body mass index is 35.53 kg/m. GEN: Well nourished, well developed, in no acute distress  HEENT: normal  Neck: no JVD, carotid bruits, or masses Cardiac: RRR; no murmurs, rubs, or gallops,no edema  Respiratory:  clear to auscultation bilaterally, normal work of breathing GI: soft, nontender, nondistended, + BS MS: no deformity or atrophy  Skin: warm and dry, no rash Neuro:  Strength and sensation are intact Psych: euthymic mood, full affect   EKG:   The ekg ordered today demonstrates NSR, no ST changes   Recent Labs: 03/04/2020: B Natriuretic Peptide 77.7; BUN 16; Creatinine, Ser 1.19; Hemoglobin 12.7; Platelets 297; Potassium 3.7; Sodium 139   Lipid Panel    Component Value Date/Time   CHOL 153 06/19/2018 1128   TRIG 116 06/19/2018 1128   HDL 50 06/19/2018 1128   CHOLHDL 3.1 06/19/2018 1128   CHOLHDL 3 08/04/2014 0900   VLDL 23.6 08/04/2014 0900   LDLCALC 80 06/19/2018 1128     Other studies Reviewed: Additional studies/ records that were reviewed today with results demonstrating: 11/21 Doppler reviewed.   ASSESSMENT AND PLAN:  1. CAD: No  angina. COntinue aggressive secondary prevention.  Hold atenolol due to bradycadia.  2. Carotid artery disease: needs repeat Doppler in 2022.  3. Hyperlipidemia: LDL 86, continue statin.  Increase exercise.  4. LE swelling: Doing better with HCTZ. Elevate legs.  5. HTN: The current medical regimen is effective;  continue present plan and medications. 6. Bradycardia: If sx, would have to stop or decrease rate slowing drugs. Diltiazem increased.  Stop atenolol.    Current medicines are reviewed at length with the patient today.  The patient concerns regarding her medicines were addressed.  The  following changes have been made:  Stop atenolol  Labs/ tests ordered today include:  No orders of the defined types were placed in this encounter.   Recommend 150 minutes/week of aerobic exercise Low fat, low carb, high fiber diet recommended  Disposition:   FU in 6 months   Signed, Larae Grooms, MD  12/27/2020 4:10 PM    [facility] [place], [place], [place]  [address] Phone: [phone]; Fax: [phone]

## 2020-12-27 ENCOUNTER — Encounter: Payer: Self-pay | Admitting: Interventional Cardiology

## 2020-12-27 ENCOUNTER — Other Ambulatory Visit: Payer: Self-pay

## 2020-12-27 ENCOUNTER — Ambulatory Visit: Payer: PPO | Admitting: Interventional Cardiology

## 2020-12-27 VITALS — BP 132/52 | HR 54 | Ht 64.0 in | Wt 207.0 lb

## 2020-12-27 DIAGNOSIS — I2510 Atherosclerotic heart disease of native coronary artery without angina pectoris: Secondary | ICD-10-CM | POA: Diagnosis not present

## 2020-12-27 DIAGNOSIS — I10 Essential (primary) hypertension: Secondary | ICD-10-CM | POA: Diagnosis not present

## 2020-12-27 DIAGNOSIS — I6523 Occlusion and stenosis of bilateral carotid arteries: Secondary | ICD-10-CM | POA: Diagnosis not present

## 2020-12-27 DIAGNOSIS — E782 Mixed hyperlipidemia: Secondary | ICD-10-CM | POA: Diagnosis not present

## 2020-12-27 NOTE — Patient Instructions (Addendum)
Medication Instructions:  Your physician has recommended you make the following change in your medication: Stop Atenolol *If you need a refill on your cardiac medications before your next appointment, please call your pharmacy*   Lab Work: none If you have labs (blood work) drawn today and your tests are completely normal, you will receive your results only by: Marland Kitchen MyChart Message (if you have MyChart) OR . A paper copy in the mail If you have any lab test that is abnormal or we need to change your treatment, we will call you to review the results.   Testing/Procedures: Your physician has requested that you have a carotid duplex. This test is an ultrasound of the carotid arteries in your neck. It looks at blood flow through these arteries that supply the brain with blood. Allow one hour for this exam. There are no restrictions or special instructions. To be done in November 2022    Follow-Up: At [facility], you and your health needs are our priority.  As part of our continuing mission to provide you with exceptional heart care, we have created designated Provider Care Teams.  These Care Teams include your primary Cardiologist (physician) and Advanced Practice Providers (APPs -  Physician Assistants and Nurse Practitioners) who all work together to provide you with the care you need, when you need it.  We recommend signing up for the patient portal called "MyChart".  Sign up information is provided on this After Visit Summary.  MyChart is used to connect with patients for Virtual Visits (Telemedicine).  Patients are able to view lab/test results, encounter notes, upcoming appointments, etc.  Non-urgent messages can be sent to your provider as well.   To learn more about what you can do with MyChart, go to [email or web address].    Your next appointment:   6 month(s)  The format for your next appointment:   In Person  Provider:   You may see Larae Grooms, MD or one of the  following Advanced Practice Providers on your designated Care Team:    Melina Copa, PA-C  Ermalinda Barrios, PA-C    Other Instructions

## 2020-12-28 DIAGNOSIS — G44209 Tension-type headache, unspecified, not intractable: Secondary | ICD-10-CM | POA: Diagnosis not present

## 2020-12-28 DIAGNOSIS — H18513 Endothelial corneal dystrophy, bilateral: Secondary | ICD-10-CM | POA: Diagnosis not present

## 2020-12-31 ENCOUNTER — Other Ambulatory Visit: Payer: Self-pay

## 2020-12-31 ENCOUNTER — Observation Stay (HOSPITAL_COMMUNITY)
Admission: EM | Admit: 2020-12-31 | Discharge: 2021-01-03 | Disposition: A | Discharge status: Home / Self Care | Payer: PPO | Attending: Internal Medicine | Admitting: Internal Medicine

## 2020-12-31 ENCOUNTER — Encounter (HOSPITAL_COMMUNITY): Payer: Self-pay | Admitting: Internal Medicine

## 2020-12-31 ENCOUNTER — Emergency Department (HOSPITAL_COMMUNITY): Payer: PPO

## 2020-12-31 DIAGNOSIS — N183 Chronic kidney disease, stage 3 unspecified: Secondary | ICD-10-CM | POA: Insufficient documentation

## 2020-12-31 DIAGNOSIS — I160 Hypertensive urgency: Secondary | ICD-10-CM | POA: Insufficient documentation

## 2020-12-31 DIAGNOSIS — R0789 Other chest pain: Secondary | ICD-10-CM | POA: Diagnosis not present

## 2020-12-31 DIAGNOSIS — Z79899 Other long term (current) drug therapy: Secondary | ICD-10-CM | POA: Diagnosis not present

## 2020-12-31 DIAGNOSIS — I129 Hypertensive chronic kidney disease with stage 1 through stage 4 chronic kidney disease, or unspecified chronic kidney disease: Secondary | ICD-10-CM | POA: Insufficient documentation

## 2020-12-31 DIAGNOSIS — R0689 Other abnormalities of breathing: Secondary | ICD-10-CM | POA: Diagnosis not present

## 2020-12-31 DIAGNOSIS — R079 Chest pain, unspecified: Secondary | ICD-10-CM | POA: Diagnosis not present

## 2020-12-31 DIAGNOSIS — I2510 Atherosclerotic heart disease of native coronary artery without angina pectoris: Secondary | ICD-10-CM | POA: Insufficient documentation

## 2020-12-31 DIAGNOSIS — I10 Essential (primary) hypertension: Secondary | ICD-10-CM

## 2020-12-31 DIAGNOSIS — I959 Hypotension, unspecified: Secondary | ICD-10-CM | POA: Diagnosis not present

## 2020-12-31 DIAGNOSIS — Z20822 Contact with and (suspected) exposure to covid-19: Secondary | ICD-10-CM | POA: Diagnosis not present

## 2020-12-31 LAB — CBC
HCT: 35.9 % — ABNORMAL LOW (ref 36.0–46.0)
Hemoglobin: 11.5 g/dL — ABNORMAL LOW (ref 12.0–15.0)
MCH: 29.9 pg (ref 26.0–34.0)
MCHC: 32 g/dL (ref 30.0–36.0)
MCV: 93.2 fL (ref 80.0–100.0)
Platelets: 342 10*3/uL (ref 150–400)
RBC: 3.85 MIL/uL — ABNORMAL LOW (ref 3.87–5.11)
RDW: 13.8 % (ref 11.5–15.5)
WBC: 10.1 10*3/uL (ref 4.0–10.5)
nRBC: 0 % (ref 0.0–0.2)

## 2020-12-31 LAB — TROPONIN I (HIGH SENSITIVITY): Troponin I (High Sensitivity): 15 ng/L (ref <18)

## 2020-12-31 LAB — BASIC METABOLIC PANEL
Anion gap: 9 (ref 5–15)
BUN: 17 mg/dL (ref 8–23)
CO2: 20 mmol/L — ABNORMAL LOW (ref 22–32)
Calcium: 9.5 mg/dL (ref 8.9–10.3)
Chloride: 105 mmol/L (ref 98–111)
Creatinine, Ser: 1.33 mg/dL — ABNORMAL HIGH (ref 0.44–1.00)
GFR, Estimated: 40 mL/min — ABNORMAL LOW (ref >60)
Glucose, Bld: 164 mg/dL — ABNORMAL HIGH (ref 70–99)
Potassium: 3.7 mmol/L (ref 3.5–5.1)
Sodium: 134 mmol/L — ABNORMAL LOW (ref 135–145)

## 2020-12-31 MED ORDER — ACETAMINOPHEN 325 MG PO TABS
650.0000 mg | ORAL_TABLET | Freq: Four times a day (QID) | ORAL | Status: DC | PRN
  Administered 2021-01-01: 650 mg via ORAL
  Filled 2020-12-31 (×2): qty 2

## 2020-12-31 MED ORDER — ACETAMINOPHEN 650 MG RE SUPP: 650.0000 mg | Freq: Four times a day (QID) | RECTAL | Status: DC | PRN

## 2020-12-31 MED ORDER — EZETIMIBE 10 MG PO TABS
10.0000 mg | ORAL_TABLET | Freq: Every day | ORAL | Status: DC
  Administered 2021-01-01 – 2021-01-03 (×3): 10 mg via ORAL
  Filled 2020-12-31 (×3): qty 1

## 2020-12-31 MED ORDER — DILTIAZEM HCL ER COATED BEADS 180 MG PO CP24
300.0000 mg | ORAL_CAPSULE | Freq: Every day | ORAL | Status: DC
  Administered 2021-01-01 – 2021-01-03 (×3): 300 mg via ORAL
  Filled 2020-12-31 (×3): qty 1

## 2020-12-31 MED ORDER — ATORVASTATIN CALCIUM 80 MG PO TABS
80.0000 mg | ORAL_TABLET | Freq: Every day | ORAL | Status: DC
  Administered 2021-01-01 – 2021-01-02 (×3): 80 mg via ORAL
  Filled 2020-12-31 (×3): qty 1

## 2020-12-31 MED ORDER — ARTIFICIAL TEARS OPHTHALMIC OINT
1.0000 "application " | TOPICAL_OINTMENT | Freq: Every day | OPHTHALMIC | Status: DC
  Administered 2021-01-01 – 2021-01-02 (×3): 1 via OPHTHALMIC
  Filled 2020-12-31: qty 3.5

## 2020-12-31 MED ORDER — SODIUM CHLORIDE (HYPERTONIC) 2 % OP SOLN
1.0000 [drp] | Freq: Three times a day (TID) | OPHTHALMIC | Status: DC
  Administered 2021-01-01 (×4): 1 [drp] via OPHTHALMIC
  Filled 2020-12-31: qty 15

## 2020-12-31 MED ORDER — TOBRAMYCIN-DEXAMETHASONE 0.3-0.1 % OP SUSP
1.0000 [drp] | Freq: Four times a day (QID) | OPHTHALMIC | Status: DC
  Filled 2020-12-31: qty 2.5

## 2020-12-31 MED ORDER — MIRABEGRON ER 25 MG PO TB24
25.0000 mg | ORAL_TABLET | Freq: Every day | ORAL | Status: DC
  Administered 2021-01-01 – 2021-01-03 (×3): 25 mg via ORAL
  Filled 2020-12-31 (×3): qty 1

## 2020-12-31 MED ORDER — ENOXAPARIN SODIUM 40 MG/0.4ML IJ SOSY
40.0000 mg | PREFILLED_SYRINGE | Freq: Every day | INTRAMUSCULAR | Status: DC
  Administered 2021-01-01 – 2021-01-03 (×3): 40 mg via SUBCUTANEOUS
  Filled 2020-12-31 (×3): qty 0.4

## 2020-12-31 MED ORDER — NITROGLYCERIN 0.4 MG SL SUBL: 0.4000 mg | SUBLINGUAL_TABLET | SUBLINGUAL | Status: DC | PRN

## 2020-12-31 MED ORDER — CLOPIDOGREL BISULFATE 75 MG PO TABS
75.0000 mg | ORAL_TABLET | Freq: Every day | ORAL | Status: DC
  Administered 2021-01-01 – 2021-01-03 (×3): 75 mg via ORAL
  Filled 2020-12-31 (×3): qty 1

## 2020-12-31 MED ORDER — LABETALOL HCL 5 MG/ML IV SOLN
10.0000 mg | Freq: Once | INTRAVENOUS | Status: DC
  Filled 2020-12-31: qty 4

## 2020-12-31 MED ORDER — HYDROCHLOROTHIAZIDE 25 MG PO TABS
25.0000 mg | ORAL_TABLET | Freq: Every day | ORAL | Status: DC
  Administered 2021-01-01 – 2021-01-03 (×3): 25 mg via ORAL
  Filled 2020-12-31 (×3): qty 1

## 2020-12-31 MED ORDER — HYDRALAZINE HCL 50 MG PO TABS
50.0000 mg | ORAL_TABLET | Freq: Three times a day (TID) | ORAL | Status: DC
  Filled 2020-12-31: qty 1

## 2020-12-31 MED ORDER — SODIUM CHLORIDE 0.9% FLUSH: 3.0000 mL | Freq: Once | INTRAVENOUS | Status: DC

## 2020-12-31 MED ORDER — HYDRALAZINE HCL 20 MG/ML IJ SOLN: 10.0000 mg | INTRAMUSCULAR | Status: DC | PRN

## 2020-12-31 MED ORDER — LOSARTAN POTASSIUM 50 MG PO TABS
100.0000 mg | ORAL_TABLET | Freq: Every day | ORAL | Status: DC
  Administered 2021-01-01 – 2021-01-03 (×3): 100 mg via ORAL
  Filled 2020-12-31 (×4): qty 2

## 2020-12-31 MED ORDER — CALCIUM CARBONATE-VITAMIN D 500-200 MG-UNIT PO TABS
1.0000 | ORAL_TABLET | Freq: Every day | ORAL | Status: DC
  Administered 2021-01-01 – 2021-01-03 (×3): 1 via ORAL
  Filled 2020-12-31 (×3): qty 1

## 2020-12-31 NOTE — ED Provider Notes (Signed)
[place] EMERGENCY DEPARTMENT Provider Note   CSN: [id number] Arrival date & time: 12/31/20  1831     History No chief complaint on file.   Lisa Petty is a 85 y.o. female.  HPI Patient presents with chest pain.  Anterior chest.  Dull.  Began yesterday.  Had seen cardiology earlier this week.  They did not really adjust medicines but reportedly on Wednesday her nephrologist increased her hydralazine from 25 mg twice a day to 50 mg 3 times a day.  States since then she has felt bad.  States she has had chest pressure.  Had nitroglycerin at home.  States her heart was pounding out of her chest.  Blood pressure elevated for EMS to 240/60.  Also elevated upon arrival here.  Has not been on this dose of hydralazine previously.  Had previously been on atenolol but had been decreased previously due to bradycardia.  Has had previous LAD stent.    Past Medical History:  Diagnosis Date  . CKD (chronic kidney disease), stage III ([place])   . Coronary atherosclerosis of native coronary artery    s/p stent to LAD 2000-no MI, right ICA 100% stenosis;left side followed by Dr. Irish Lack  . Essential hypertension, benign   . Low back pain   . Mixed hyperlipidemia   . Occlusion and stenosis of carotid artery without mention of cerebral infarction   . Osteopenia    mild to normal (BMD 08/18/09 normal)  . Overactive bladder    on detrol   . Vitamin D deficiency    repleted by ASCUS    Patient Active Problem List   Diagnosis Date Noted  . Chest pain 12/31/2020  . Coronary atherosclerosis of native coronary artery   . Essential hypertension, benign   . Carotid artery disease ([place])   . Mixed hyperlipidemia     Past Surgical History:  Procedure Laterality Date  . AUGMENTATION MAMMAPLASTY Bilateral 1976     OB History   No obstetric history on file.     Family History  Problem Relation Age of Onset  . Dementia Mother   . CAD Father   . Lung cancer Father   . Heart  attack Father   . Diabetes Sister   . CAD Brother   . Heart attack Brother   . CAD Son   . Heart attack Son     Social History   Tobacco Use  . Smoking status: Never Smoker  . Smokeless tobacco: Never Used  Substance Use Topics  . Alcohol use: No    Alcohol/week: 0.0 standard drinks    Home Medications Prior to Admission medications   Medication Sig Start Date End Date Taking? Authorizing Provider  atorvastatin (LIPITOR) 80 MG tablet Take 80 mg by mouth at bedtime. 05/03/15  Yes [provider]  Calcium Carbonate-Vitamin D 600-400 MG-UNIT tablet Take 1 tablet by mouth daily.    Yes [provider]  clopidogrel (PLAVIX) 75 MG tablet Take 1 tablet by mouth once daily 08/11/20  Yes Jettie Booze, MD  diltiazem (CARDIZEM CD) 300 MG 24 hr capsule Take 300 mg by mouth daily. 12/28/20  Yes [provider]  ezetimibe (ZETIA) 10 MG tablet Take 1 tablet by mouth once daily 07/30/20  Yes Jettie Booze, MD  gentamicin-prednisoLONE 0.3-1 % ophthalmic drops Place 1 drop into both eyes 2 (two) times daily.   Yes [provider]  hydrALAZINE (APRESOLINE) 25 MG tablet Take 50 mg by mouth 3 (three)  times daily.   Yes [provider]  hydrochlorothiazide (HYDRODIURIL) 25 MG tablet Take 1 tablet by mouth daily. 11/30/20  Yes [provider]  losartan (COZAAR) 100 MG tablet Take 100 mg by mouth daily. 04/24/17  Yes [provider]  mirabegron ER (MYRBETRIQ) 25 MG TB24 tablet Take 25 mg by mouth daily.   Yes [provider]  nitroGLYCERIN (NITROSTAT) 0.4 MG SL tablet Place 1 tablet (0.4 mg total) under the tongue every 5 (five) minutes as needed for chest pain. 10/10/18  Yes Jettie Booze, MD  sodium chloride (MURO 128) 5 % ophthalmic ointment Place 1 application into both eyes at bedtime.   Yes [provider]  Sodium Chloride, Hypertonic, (MURO 128 OP) Apply 4 drops to eye 4 (four) times daily.   Yes  [provider]    Allergies    Lisinopril  Review of Systems   Review of Systems  Constitutional: Negative for appetite change and fever.  HENT: Negative for congestion.   Cardiovascular: Positive for chest pain and palpitations.  Gastrointestinal: Negative for abdominal pain.  Genitourinary: Negative for flank pain.  Musculoskeletal: Negative for back pain.  Skin: Negative for rash.  Neurological: Negative for weakness.  Psychiatric/Behavioral: Negative for confusion.    Physical Exam Updated Vital Signs BP (!) 162/57   Pulse 86   Temp 98.6 F (37 C) (Oral)   Resp 13   Ht 5\' 2"  (1.575 m)   Wt 93.9 kg   SpO2 95%   BMI 37.86 kg/m   Physical Exam Vitals and nursing note reviewed.  HENT:     Head: Atraumatic.  Eyes:     Pupils: Pupils are equal, round, and reactive to light.  Cardiovascular:     Rate and Rhythm: Regular rhythm.  Pulmonary:     Breath sounds: No wheezing or rhonchi.  Abdominal:     Tenderness: There is no abdominal tenderness.  Musculoskeletal:        General: No tenderness.     Right lower leg: No edema.     Left lower leg: No edema.  Skin:    General: Skin is warm.     Capillary Refill: Capillary refill takes less than 2 seconds.  Neurological:     Mental Status: She is alert and oriented to person, place, and time.     ED Results / Procedures / Treatments   Labs (all labs ordered are listed, but only abnormal results are displayed) Labs Reviewed  BASIC METABOLIC PANEL - Abnormal; Notable for the following components:      Result Value   Sodium 134 (*)    CO2 20 (*)    Glucose, Bld 164 (*)    Creatinine, Ser 1.33 (*)    GFR, Estimated 40 (*)    All other components within normal limits  CBC - Abnormal; Notable for the following components:   RBC 3.85 (*)    Hemoglobin 11.5 (*)    HCT 35.9 (*)    All other components within normal limits  SARS CORONAVIRUS 2 (TAT 6-24 HRS)  TROPONIN I (HIGH SENSITIVITY)    EKG EKG  Interpretation  Date/Time:  Friday Dec 31 2020 18:48:15 EDT Ventricular Rate:  98 PR Interval:  212 QRS Duration: 86 QT Interval:  371 QTC Calculation: 474 R Axis:   5 Text Interpretation: Sinus rhythm Borderline prolonged PR interval Nonspecific T abnrm, anterolateral leads Confirmed by Davonna Belling [phone]) on 12/31/2020 6:49:48 PM   Radiology DG Chest 2 View  Result Date: 12/31/2020 CLINICAL DATA:  Chest pain EXAM: CHEST - 2 VIEW COMPARISON:  07/26/2020 FINDINGS: No focal opacity or pleural effusion. Chronic bronchitic changes. Stable cardiomediastinal silhouette with aortic atherosclerosis. No pneumothorax. Calcified breast implants. Calcified nodule in the right upper lobe. IMPRESSION: No active cardiopulmonary disease. Electronically Signed   By: Donavan Foil M.D.   On: 12/31/2020 19:31    Procedures Procedures   Medications Ordered in ED Medications  sodium chloride flush (NS) 0.9 % injection 3 mL (has no administration in time range)    ED Course  I have reviewed the triage vital signs and the nursing notes.  Pertinent labs & imaging results that were available during my care of the patient were reviewed by me and considered in my medical decision making (see chart for details).    MDM Rules/Calculators/A&P                          Patient Libby Maw with chest pain.  Recently had change in medications by nephrology.  Increased from 50 mg to 150 mg of hydralazine a day.  Since then patient states she has been feeling bad.  States that has chest pain in the chest.  Pressure.  Also feeling her heart beating heavily does have cardiac history.  Initially hypertensive here.  Heart rate above her baseline 2.  Potentially secondary to the hydralazine.  However patient feeling worse and with chest pain I feels the patient benefit from mission to the hospital.  EKG reassuring and nonischemic.  However the chest pain was not relieved by nitroglycerin.  Initially had ordered IV  labetalol to help get the pressure down after discussion with cardiology but had come down some on its own and pain felt better.  Admitted by hospitalist. Final Clinical Impression(s) / ED Diagnoses Final diagnoses:  None    Rx / DC Orders ED Discharge Orders    None       Davonna Belling, MD 12/31/20 2312

## 2020-12-31 NOTE — ED Notes (Signed)
Attempted to call report to [facility], will return call

## 2020-12-31 NOTE — ED Notes (Signed)
Pt to xray via stretcher

## 2020-12-31 NOTE — ED Notes (Signed)
Pt resting on stretcher, family at bedside, pt reports relief of chest pressure. MD informed of most recent BP

## 2020-12-31 NOTE — ED Triage Notes (Signed)
Pt arrives via EMS from home with c/o chest pain. Pt takes hydralazine. Increased on the 18th to 50mg  3X a day. Pt states she has not felt good since.  Pt took on own 2Xnitro no relief .. 240/60 EMS gave 1X nitro 324 ASA 4mg  Morphine with no relief .Marland Kitchen   Alert and oriented X4

## 2020-12-31 NOTE — ED Notes (Signed)
Report given to 3E 

## 2020-12-31 NOTE — H&P (Addendum)
History and Physical    TEQUITA MARRS MHD:[id number] DOB: 1935-11-18 DOA: 12/31/2020  PCP: [name], MD  Patient coming from: Home.  Chief Complaint: Chest pain.  HPI: Lisa Petty is a 85 y.o. female with history of CAD status post stenting, hypertension, chronic kidney disease stage III, hyperlipidemia, carotid occlusion presents to the ER with complaint of chest pain.  Patient started having chest pain afternoon after patient had lunch and was walking back to her house from the car.  Pain was retrosternal nonradiating with no associated shortness of breath.  Patient took 2 sublingual nitroglycerin the chest pain did not resolve and called EMS and patient was given additional sublingual nitroglycerin and was brought to the ER.  Patient states hydralazine dose was recently increased by her nephrologist from 25 twice daily to 50 mg 3 times daily due to worsening blood pressure.  Patient states since her hydralazine dose was increased she was getting some chest pressure and palpitations.  ED Course: In the ER patient chest pain resolved after patient was given morphine.  EKG shows normal sinus rhythm.  Chest x-ray unremarkable.  Patient's blood pressure was markedly elevated at systolic more than 211.  Patient was given IV labetalol following which blood pressure improved.  High sensitive troponins were 15 and 104.  Cardiologist was consulted and patient was admitted for further management.  Patient does not want to take her increased dose of hydralazine and would like to go back to oral dose of hydralazine.  Review of Systems: As per HPI, rest all negative.   Past Medical History:  Diagnosis Date  . CKD (chronic kidney disease), stage III ([place])   . Coronary atherosclerosis of native coronary artery    s/p stent to LAD 2000-no MI, right ICA 100% stenosis;left side followed by Dr. Irish Lack  . Essential hypertension, benign   . Low back pain   . Mixed hyperlipidemia   . Occlusion  and stenosis of carotid artery without mention of cerebral infarction   . Osteopenia    mild to normal (BMD 08/18/09 normal)  . Overactive bladder    on detrol   . Vitamin D deficiency    repleted by ASCUS    Past Surgical History:  Procedure Laterality Date  . AUGMENTATION MAMMAPLASTY Bilateral 1976     reports that she has never smoked. She has never used smokeless tobacco. She reports that she does not drink alcohol. No history on file for drug use.  Allergies  Allergen Reactions  . Lisinopril Cough    Family History  Problem Relation Age of Onset  . Dementia Mother   . CAD Father   . Lung cancer Father   . Heart attack Father   . Diabetes Sister   . CAD Brother   . Heart attack Brother   . CAD Son   . Heart attack Son     Prior to Admission medications   Medication Sig Start Date End Date Taking? Authorizing Provider  atorvastatin (LIPITOR) 80 MG tablet Take 80 mg by mouth at bedtime. 05/03/15  Yes [provider]  Calcium Carbonate-Vitamin D 600-400 MG-UNIT tablet Take 1 tablet by mouth daily.    Yes [provider]  clopidogrel (PLAVIX) 75 MG tablet Take 1 tablet by mouth once daily 08/11/20  Yes Jettie Booze, MD  diltiazem (CARDIZEM CD) 300 MG 24 hr capsule Take 300 mg by mouth daily. 12/28/20  Yes [provider]  ezetimibe (ZETIA) 10 MG tablet Take 1 tablet by  mouth once daily 07/30/20  Yes Jettie Booze, MD  gentamicin-prednisoLONE 0.3-1 % ophthalmic drops Place 1 drop into both eyes 2 (two) times daily.   Yes [provider]  hydrALAZINE (APRESOLINE) 25 MG tablet Take 50 mg by mouth 3 (three) times daily.   Yes [provider]  hydrochlorothiazide (HYDRODIURIL) 25 MG tablet Take 1 tablet by mouth daily. 11/30/20  Yes [provider]  losartan (COZAAR) 100 MG tablet Take 100 mg by mouth daily. 04/24/17  Yes [provider]  mirabegron ER (MYRBETRIQ) 25 MG TB24 tablet Take 25 mg by mouth  daily.   Yes [provider]  nitroGLYCERIN (NITROSTAT) 0.4 MG SL tablet Place 1 tablet (0.4 mg total) under the tongue every 5 (five) minutes as needed for chest pain. 10/10/18  Yes Jettie Booze, MD  sodium chloride (MURO 128) 5 % ophthalmic ointment Place 1 application into both eyes at bedtime.   Yes [provider]  Sodium Chloride, Hypertonic, (MURO 128 OP) Apply 4 drops to eye 4 (four) times daily.   Yes [provider]    Physical Exam: Constitutional: Moderately built and nourished. Vitals:   12/31/20 1850 12/31/20 1857 12/31/20 1900 12/31/20 2040  BP: (!) 206/51  (!) 197/48 (!) 162/57  Pulse: 95  94 86  Resp: 20  19 13   Temp: 98.6 F (37 C)     TempSrc: Oral     SpO2: 97%  95% 95%  Weight:  93.9 kg    Height:  5\' 2"  (1.575 m)     Eyes: Anicteric no pallor. ENMT: No discharge from the ears eyes nose and mouth. Neck: No mass felt.  No neck rigidity. Respiratory: No rhonchi or crepitations. Cardiovascular: S1-S2 heard. Abdomen: Soft nontender bowel sounds present. Musculoskeletal: No edema. Skin: No rash. Neurologic: Alert awake oriented to time place and person.  Moves all extremities. Psychiatric: Appears normal.  Normal affect.   Labs on Admission: I have personally reviewed following labs and imaging studies  CBC: Recent Labs  Lab 12/31/20 1854  WBC 10.1  HGB 11.5*  HCT 35.9*  MCV 93.2  PLT 517   Basic Metabolic Panel: Recent Labs  Lab 12/31/20 1854  NA 134*  K 3.7  CL 105  CO2 20*  GLUCOSE 164*  BUN 17  CREATININE 1.33*  CALCIUM 9.5   GFR: Estimated Creatinine Clearance: 34.8 mL/min (A) (by C-G formula based on SCr of 1.33 mg/dL (H)). Liver Function Tests: No results for input(s): AST, ALT, ALKPHOS, BILITOT, PROT, ALBUMIN in the last 168 hours. No results for input(s): LIPASE, AMYLASE in the last 168 hours. No results for input(s): AMMONIA in the last 168 hours. Coagulation Profile: No results for input(s):  INR, PROTIME in the last 168 hours. Cardiac Enzymes: No results for input(s): CKTOTAL, CKMB, CKMBINDEX, TROPONINI in the last 168 hours. BNP (last 3 results) No results for input(s): PROBNP in the last 8760 hours. HbA1C: No results for input(s): HGBA1C in the last 72 hours. CBG: No results for input(s): GLUCAP in the last 168 hours. Lipid Profile: No results for input(s): CHOL, HDL, LDLCALC, TRIG, CHOLHDL, LDLDIRECT in the last 72 hours. Thyroid Function Tests: No results for input(s): TSH, T4TOTAL, FREET4, T3FREE, THYROIDAB in the last 72 hours. Anemia Panel: No results for input(s): VITAMINB12, FOLATE, FERRITIN, TIBC, IRON, RETICCTPCT in the last 72 hours. Urine analysis:    Component Value Date/Time   COLORURINE COLORLESS (A) 03/04/2020 1819   APPEARANCEUR CLEAR 03/04/2020 1819   LABSPEC 1.003 (  L) 03/04/2020 1819   PHURINE 7.0 03/04/2020 1819   GLUCOSEU NEGATIVE 03/04/2020 1819   HGBUR SMALL (A) 03/04/2020 1819   BILIRUBINUR NEGATIVE 03/04/2020 1819   KETONESUR NEGATIVE 03/04/2020 1819   PROTEINUR NEGATIVE 03/04/2020 1819   NITRITE NEGATIVE 03/04/2020 1819   LEUKOCYTESUR NEGATIVE 03/04/2020 1819   Sepsis Labs: @LABRCNTIP (procalcitonin:4,lacticidven:4) )No results found for this or any previous visit (from the past 240 hour(s)).   Radiological Exams on Admission: DG Chest 2 View  Result Date: 12/31/2020 CLINICAL DATA:  Chest pain EXAM: CHEST - 2 VIEW COMPARISON:  07/26/2020 FINDINGS: No focal opacity or pleural effusion. Chronic bronchitic changes. Stable cardiomediastinal silhouette with aortic atherosclerosis. No pneumothorax. Calcified breast implants. Calcified nodule in the right upper lobe. IMPRESSION: No active cardiopulmonary disease. Electronically Signed   By: Donavan Foil M.D.   On: 12/31/2020 19:31    EKG: Independently reviewed.  Normal sinus rhythm.  Assessment/Plan Principal Problem:   Chest pain Active Problems:   Coronary atherosclerosis of native  coronary artery   Hypertensive urgency    1. Chest pain -improved after patient was given morphine and also improvement of blood pressure.  Given the history of CAD we will cycle cardiac markers continue with patient's antiplatelet agents statins.  Consult cardiology. 2. Hypertensive urgency likely contributing to patient's chest pain.  Blood pressure improved after patient was given IV labetalol and also morphine.  Patient wants to go back to full dose of hydralazine which have changed.  Patient is also on ARB, hydrochlorothiazide and Cardizem.  Will await further recommendation per cardiology. 3. Chronic kidney disease stage III creatinine appears to be at baseline. 4. Hyperlipidemia on statins.   DVT prophylaxis: Lovenox. Code Status: Full code. Family Communication: Discussed with patient. Disposition Plan: Home. Consults called: Cardiology. Admission status: Observation.   Rise Patience MD [facility] Pager [phone].  If 7PM-7AM, please contact night-coverage [email or web address] Password [facility]  12/31/2020, 11:26 PM

## 2020-12-31 NOTE — ED Notes (Signed)
Report given to [facility]

## 2021-01-01 ENCOUNTER — Observation Stay (HOSPITAL_BASED_OUTPATIENT_CLINIC_OR_DEPARTMENT_OTHER): Payer: PPO

## 2021-01-01 DIAGNOSIS — I208 Other forms of angina pectoris: Secondary | ICD-10-CM | POA: Diagnosis not present

## 2021-01-01 DIAGNOSIS — I10 Essential (primary) hypertension: Secondary | ICD-10-CM | POA: Diagnosis not present

## 2021-01-01 DIAGNOSIS — R079 Chest pain, unspecified: Secondary | ICD-10-CM | POA: Diagnosis not present

## 2021-01-01 DIAGNOSIS — R072 Precordial pain: Secondary | ICD-10-CM | POA: Diagnosis not present

## 2021-01-01 DIAGNOSIS — I160 Hypertensive urgency: Secondary | ICD-10-CM | POA: Diagnosis not present

## 2021-01-01 DIAGNOSIS — I2510 Atherosclerotic heart disease of native coronary artery without angina pectoris: Secondary | ICD-10-CM | POA: Diagnosis not present

## 2021-01-01 LAB — CBC
HCT: 36.2 % (ref 36.0–46.0)
Hemoglobin: 12 g/dL (ref 12.0–15.0)
MCH: 29.9 pg (ref 26.0–34.0)
MCHC: 33.1 g/dL (ref 30.0–36.0)
MCV: 90.3 fL (ref 80.0–100.0)
Platelets: 328 10*3/uL (ref 150–400)
RBC: 4.01 MIL/uL (ref 3.87–5.11)
RDW: 13.8 % (ref 11.5–15.5)
WBC: 9.4 10*3/uL (ref 4.0–10.5)
nRBC: 0 % (ref 0.0–0.2)

## 2021-01-01 LAB — BASIC METABOLIC PANEL
Anion gap: 8 (ref 5–15)
BUN: 17 mg/dL (ref 8–23)
CO2: 22 mmol/L (ref 22–32)
Calcium: 9.5 mg/dL (ref 8.9–10.3)
Chloride: 106 mmol/L (ref 98–111)
Creatinine, Ser: 1.08 mg/dL — ABNORMAL HIGH (ref 0.44–1.00)
GFR, Estimated: 51 mL/min — ABNORMAL LOW (ref >60)
Glucose, Bld: 121 mg/dL — ABNORMAL HIGH (ref 70–99)
Potassium: 3.6 mmol/L (ref 3.5–5.1)
Sodium: 136 mmol/L (ref 135–145)

## 2021-01-01 LAB — TROPONIN I (HIGH SENSITIVITY)
Troponin I (High Sensitivity): 104 ng/L (ref <18)
Troponin I (High Sensitivity): 61 ng/L — ABNORMAL HIGH (ref <18)

## 2021-01-01 LAB — SARS CORONAVIRUS 2 (TAT 6-24 HRS): SARS Coronavirus 2: NEGATIVE

## 2021-01-01 LAB — MAGNESIUM: Magnesium: 1.7 mg/dL (ref 1.7–2.4)

## 2021-01-01 MED ORDER — SODIUM CHLORIDE (HYPERTONIC) 2 % OP SOLN
1.0000 [drp] | OPHTHALMIC | Status: DC
  Administered 2021-01-01 – 2021-01-03 (×10): 1 [drp] via OPHTHALMIC
  Filled 2021-01-01: qty 15

## 2021-01-01 MED ORDER — HYDRALAZINE HCL 20 MG/ML IJ SOLN
10.0000 mg | INTRAMUSCULAR | Status: DC | PRN
  Administered 2021-01-01: 10 mg via INTRAVENOUS
  Filled 2021-01-01: qty 1

## 2021-01-01 MED ORDER — ISOSORBIDE MONONITRATE ER 30 MG PO TB24
30.0000 mg | ORAL_TABLET | Freq: Every day | ORAL | Status: DC
  Administered 2021-01-01 – 2021-01-03 (×3): 30 mg via ORAL
  Filled 2021-01-01 (×3): qty 1

## 2021-01-01 MED ORDER — LABETALOL HCL 5 MG/ML IV SOLN
10.0000 mg | INTRAVENOUS | Status: DC | PRN
  Administered 2021-01-01 – 2021-01-02 (×3): 10 mg via INTRAVENOUS
  Filled 2021-01-01 (×3): qty 4

## 2021-01-01 MED ORDER — HYDRALAZINE HCL 25 MG PO TABS
25.0000 mg | ORAL_TABLET | Freq: Two times a day (BID) | ORAL | Status: DC
  Administered 2021-01-01 – 2021-01-03 (×5): 25 mg via ORAL
  Filled 2021-01-01 (×5): qty 1

## 2021-01-01 MED ORDER — TOBRAMYCIN-DEXAMETHASONE 0.3-0.1 % OP SUSP
1.0000 [drp] | OPHTHALMIC | Status: DC
  Administered 2021-01-01 (×2): 1 [drp] via OPHTHALMIC
  Filled 2021-01-01: qty 2.5

## 2021-01-01 NOTE — Progress Notes (Signed)
   01/01/21 0040  Vitals  Temp 98.5 F (36.9 C)  Temp Source Oral  BP (!) 192/64  MAP (mmHg) 100  BP Location Right Arm  BP Method Automatic  Patient Position (if appropriate) Sitting  Pulse Rate 95  Resp 16  Level of Consciousness  Level of Consciousness Alert  MEWS COLOR  MEWS Score Color Green  Oxygen Therapy  SpO2 98 %  O2 Device Room Air  MEWS Score  MEWS Temp 0  MEWS Systolic 0  MEWS Pulse 0  MEWS RR 0  MEWS LOC 0  MEWS Score 0  Admitted pt to rm 3E23 from ED, alert and oriented x4, denies pain at this time, oriented to room, call bell placed within reach, hooked to cardiac monitor, CCMD mad aware. Pt's blood pressure elevated, refused to take hydralazine, Dr. Hal Hope notified, order for labetalol 10mg  IV obtained and given.

## 2021-01-01 NOTE — Consult Note (Signed)
Cardiology Consultation:   Patient ID: Lisa Petty MRN: [id number]; DOB: 13-May-1936  Admit date: 12/31/2020 Date of Consult: 01/01/2021  PCP:  [name], MD   [facility] Providers Cardiologist:  Larae Grooms, MD      Patient Profile:   Lisa Petty is a 85 y.o. female with a hx of CAD s/p LAD stent in 2000, HTN, CKD III, HLD, carotid artery disease with occluded R ICA, PVD, who is being seen 01/01/2021 for the evaluation of chest pain at the request of Dr. Hal Hope.   History of Present Illness:   Ms. Lisa Petty follows Dr Irish Lack for CAD routinely. She had LAD stent placed in 2000. Sh had a negative Myoview on 06/2018. She had bradycardia (HR 40s) issue in 2021 with dizziness and presyncope, symptoms resolved after decreasing atenolol dosing. She was placed on Holter monitor which showed sinus rhythm with rare PAC and PVCs, average HR 60s. She had Myoview repeated on 03/16/20 that is a low risk study and negative for ischemia.   She was last evaluated in the office on 12/27/20 for routine follow up.  She had no angina symptoms. She was continued on lipitor 80mg , zetia,  Plavix 75mg , losartan 25mg  for medical therapy. Due to bradycardia 54bpm, she was advised to stop atenolol, her Cardizem was increased from 240 to 300mg  (by PCP), and she was continued on HCTZ/hydralazine for HTN.   Her last cartoid doppler was 06/23/20 showed total occlusion of the right ICA, left ICA are consistent with a 1-39% stenosis. Bilateral vertebral arteries demonstrate antegrade flow. Normal flow hemodynamics were seen in bilateral subclavian arteries.  Patient presented to ER on 12/31/20 with c/o chest pain. She states her nephrologist changed her hydralazine from 25mg  BID to 50mg  TID for HTN control. She states since the medication change, she has been having heart racing sensation when she walks. She reports SOB associated with these episodes. Yesterday she went out for lunch and started  having chest pain when she get back to home. She states pain was located on the left side of chest, 8/10 aching sensation, radiating to the left arm. She reports associated SOB and nausea without emesis. She took 2 SL Nitro without relief and called EMS. She states her pain was persistent until she arrived ER, started to get better after she received ASA and morphine at ED. She felt hydralazine up-titration is causing her symptoms. She uses 3 pillows to sleep at night, states she never lays flat because it feels off for her. She denied any significant weight gain or leg edema or dizziness. She is currently pain free. She states her BP has been high at home [id number] systolic and she takes her medication.   Admission diagnostic showed mild hyponatremia 134, biacarb 20, glucose 164, Cr 1.33, and GFR 40. CBC with Hgb 11.5, otherwise unremarkable. Hs Trop 15 >104. COVID negative. CXR no acute findings. EKG with sinus rhythm with rate of 98 bpm, first degree AV block, no acute ST-T changed. She was hypertensive with BP 206/51 at ED. She was given IV Labetalol, subsequently admitted to hospital medicine. Cardiology is consulted for further management.     Past Medical History:  Diagnosis Date  . CKD (chronic kidney disease), stage III ([place])   . Coronary atherosclerosis of native coronary artery    s/p stent to LAD 2000-no MI, right ICA 100% stenosis;left side followed by Dr. Irish Lack  . Essential hypertension, benign   . Low back pain   . Mixed hyperlipidemia   .  Occlusion and stenosis of carotid artery without mention of cerebral infarction   . Osteopenia    mild to normal (BMD 08/18/09 normal)  . Overactive bladder    on detrol   . Vitamin D deficiency    repleted by ASCUS    Past Surgical History:  Procedure Laterality Date  . AUGMENTATION MAMMAPLASTY Bilateral 1976     Home Medications:  Prior to Admission medications   Medication Sig Start Date End Date Taking? Authorizing Provider   atorvastatin (LIPITOR) 80 MG tablet Take 80 mg by mouth at bedtime. 05/03/15  Yes [provider]  Calcium Carbonate-Vitamin D 600-400 MG-UNIT tablet Take 1 tablet by mouth daily.    Yes [provider]  clopidogrel (PLAVIX) 75 MG tablet Take 1 tablet by mouth once daily 08/11/20  Yes Jettie Booze, MD  diltiazem (CARDIZEM CD) 300 MG 24 hr capsule Take 300 mg by mouth daily. 12/28/20  Yes [provider]  ezetimibe (ZETIA) 10 MG tablet Take 1 tablet by mouth once daily 07/30/20  Yes Jettie Booze, MD  gentamicin-prednisoLONE 0.3-1 % ophthalmic drops Place 1 drop into both eyes 2 (two) times daily.   Yes [provider]  hydrALAZINE (APRESOLINE) 25 MG tablet Take 50 mg by mouth 3 (three) times daily.   Yes [provider]  hydrochlorothiazide (HYDRODIURIL) 25 MG tablet Take 1 tablet by mouth daily. 11/30/20  Yes [provider]  losartan (COZAAR) 100 MG tablet Take 100 mg by mouth daily. 04/24/17  Yes [provider]  mirabegron ER (MYRBETRIQ) 25 MG TB24 tablet Take 25 mg by mouth daily.   Yes [provider]  nitroGLYCERIN (NITROSTAT) 0.4 MG SL tablet Place 1 tablet (0.4 mg total) under the tongue every 5 (five) minutes as needed for chest pain. 10/10/18  Yes Jettie Booze, MD  sodium chloride (MURO 128) 5 % ophthalmic ointment Place 1 application into both eyes at bedtime.   Yes [provider]  Sodium Chloride, Hypertonic, (MURO 128 OP) Apply 4 drops to eye 4 (four) times daily.   Yes [provider]    Inpatient Medications: Scheduled Meds: . artificial tears  1 application Both Eyes QHS  . atorvastatin  80 mg Oral QHS  . calcium-vitamin D  1 tablet Oral Q breakfast  . clopidogrel  75 mg Oral Daily  . diltiazem  300 mg Oral Daily  . enoxaparin (LOVENOX) injection  40 mg Subcutaneous Daily  . ezetimibe  10 mg Oral Daily  . hydrALAZINE  25 mg Oral BID  . hydrochlorothiazide  25 mg  Oral Daily  . losartan  100 mg Oral Daily  . mirabegron ER  25 mg Oral Daily  . sodium chloride  1 drop Both Eyes TID AC & HS  . tobramycin-dexamethasone  1 drop Both Eyes Q6H   Continuous Infusions:  PRN Meds: acetaminophen **OR** acetaminophen, labetalol, nitroGLYCERIN  Allergies:    Allergies  Allergen Reactions  . Lisinopril Cough    Social History:   Social History   Socioeconomic History  . Marital status: Divorced    Spouse name: Not on file  . Number of children: Not on file  . Years of education: Not on file  . Highest education level: Not on file  Occupational History  . Not on file  Tobacco Use  . Smoking status: Never Smoker  . Smokeless tobacco: Never Used  Substance and Sexual Activity  . Alcohol use: No    Alcohol/week: 0.0 standard drinks  .  Drug use: Not on file  . Sexual activity: Not on file  Other Topics Concern  . Not on file  Social History Narrative  . Not on file   Social Determinants of Health   Financial Resource Strain: Not on file  Food Insecurity: Not on file  Transportation Needs: Not on file  Physical Activity: Not on file  Stress: Not on file  Social Connections: Not on file  Intimate Partner Violence: Not on file    Family History:    Family History  Problem Relation Age of Onset  . Dementia Mother   . CAD Father   . Lung cancer Father   . Heart attack Father   . Diabetes Sister   . CAD Brother   . Heart attack Brother   . CAD Son   . Heart attack Son      ROS:  Constitutional: Denied fever, chills, malaise, night sweats Eyes: Denied vision change or loss Ears/Nose/Mouth/Throat: Denied ear ache, sore throat, coughing, sinus pain Cardiovascular: see HPI Respiratory: denied shortness of breath Gastrointestinal: Denied nausea, vomiting, abdominal pain, diarrhea Genital/Urinary: Denied dysuria, hematuria, urinary frequency/urgency Musculoskeletal: Denied muscle ache, joint pain, weakness Skin: Denied rash,  wound Neuro: Denied headache, dizziness, syncope Psych: Denied history of depression/anxiety  Endocrine: Denied history of diabetes   Physical Exam/Data:   Vitals:   01/01/21 0253 01/01/21 0508 01/01/21 0807 01/01/21 0832  BP: (!) 156/55 (!) 156/67 (!) 176/76 (!) 200/67  Pulse: 78 76 87 85  Resp: 18 20  18   Temp:  97.9 F (36.6 C) 97.9 F (36.6 C) 97.7 F (36.5 C)  TempSrc:  Oral Oral Oral  SpO2: 95% 97% 99% 100%  Weight:  94.5 kg    Height:  5\' 2"  (1.575 m)      Intake/Output Summary (Last 24 hours) at 01/01/2021 0904 Last data filed at 01/01/2021 0837 Gross per 24 hour  Intake 600 ml  Output 550 ml  Net 50 ml   Last 3 Weights 01/01/2021 12/31/2020 12/27/2020  Weight (lbs) 208 lb 6.4 oz 207 lb 207 lb  Weight (kg) 94.53 kg 93.895 kg 93.895 kg     Body mass index is 38.12 kg/m.   Vitals:  Vitals:   01/01/21 0807 01/01/21 0832  BP: (!) 176/76 (!) 200/67  Pulse: 87 85  Resp:  18  Temp: 97.9 F (36.6 C) 97.7 F (36.5 C)  SpO2: 99% 100%   General Appearance: In no apparent distress, laying in bed, obese  HEENT: Normocephalic, atraumatic. EOMs intact. Sclera anicteric.  Neck: short and thick neck, no JVDs Cardiovascular: Regular rate and rhythm, normal S1-S2,  no murmur/rub/gallop Respiratory: Resting breathing unlabored, lungs sounds clear to auscultation bilaterally, no use of accessory muscles. On room air.  No wheezes, rales or rhonchi.   Gastrointestinal: Bowel sounds positive, abdomen soft, non-tender, non-distended. Extremities: Able to move all extremities in bed without difficulty, mild ankle edema  Genitourinary: genital exam not performed Musculoskeletal: Normal muscle bulk and tone, muscle strength 5/5 throughout, no limited range of motion Skin: Intact, warm, dry. No rashes or petechiae noted in exposed areas.  Neurologic: Alert, oriented to person, place and time. Fluent speech,  no cognitive deficit, no gross focal neuro deficit Psychiatric: Normal  affect. Mood is appropriate.    EKG:  The EKG was personally reviewed and demonstrates:  EKG with sinus rhythm with rate of 98 bpm, first degree AV block, no acute ST-T changed.  Telemetry:  Telemetry was personally reviewed and demonstrates:  Sinus  rhythm with rate of 90s, occasional PACs and PVCs, artifacts   Relevant CV Studies:  Carotid doppler 06/23/20:  Right Carotid: Evidence consistent with a total occlusion of the right ICA.  Non-hemodynamically significant plaque <50% noted in the CCA.  The ECA appears >50% stenosed.  Left Carotid: Velocities in the left ICA are consistent with a 1-39% stenosis.  Vertebrals: Bilateral vertebral arteries demonstrate antegrade flow.  Subclavians: Normal flow hemodynamics were seen in bilateral subclavian arteries   Long term monitor 03/12/20:   Sinus rhythm with rare PACS, PVCs and short runs of PACs.  Minimum HR 41 bpm at 2:36 PM.  Average HR 60 bpm.   No clear indication of rhythm that necessitates pacemaker; but would have to take symptoms into consideration   Myoview 03/16/2020:   Nuclear stress EF: 71%. No wall motion abnormalities.  There was no ST segment deviation noted during stress.  No T wave inversion was noted during stress.  The study is normal.  This is a low risk study.     Laboratory Data:  High Sensitivity Troponin:   Recent Labs  Lab 12/31/20 1854 01/01/21 0046  TROPONINIHS 15 104*     Chemistry Recent Labs  Lab 12/31/20 1854 01/01/21 0046  NA 134* 136  K 3.7 3.6  CL 105 106  CO2 20* 22  GLUCOSE 164* 121*  BUN 17 17  CREATININE 1.33* 1.08*  CALCIUM 9.5 9.5  GFRNONAA 40* 51*  ANIONGAP 9 8    No results for input(s): PROT, ALBUMIN, AST, ALT, ALKPHOS, BILITOT in the last 168 hours. Hematology Recent Labs  Lab 12/31/20 1854 01/01/21 0046  WBC 10.1 9.4  RBC 3.85* 4.01  HGB 11.5* 12.0  HCT 35.9* 36.2  MCV 93.2 90.3  MCH 29.9 29.9  MCHC 32.0 33.1  RDW 13.8 13.8  PLT 342 328    BNPNo results for input(s): BNP, PROBNP in the last 168 hours.  DDimer No results for input(s): DDIMER in the last 168 hours.   Radiology/Studies:  DG Chest 2 View  Result Date: 12/31/2020 CLINICAL DATA:  Chest pain EXAM: CHEST - 2 VIEW COMPARISON:  07/26/2020 FINDINGS: No focal opacity or pleural effusion. Chronic bronchitic changes. Stable cardiomediastinal silhouette with aortic atherosclerosis. No pneumothorax. Calcified breast implants. Calcified nodule in the right upper lobe. IMPRESSION: No active cardiopulmonary disease. Electronically Signed   By: Donavan Foil M.D.   On: 12/31/2020 19:31     Assessment and Plan:   Chest pain Elevated Troponin CAD /p LAD stent in 2000 - presented with chest pain occurring after lunch while walking , no relieve from Nitro, hypertensive  - currently chest pain free - HS Trop 15 >104, will trend 3rd trop now  - EKG without acute ischemic changes  - Myoview negative from 03/2020  - consider LHC if trop uptrending, if downtrend/plataeu, likely demand ischemia from HTN  - continue GDMT with plavix, statin, zetia, losartan (atenolol discontinued 12/27/20 in office due to bradycardia)  - will check Echo   Hypertensive emergency  - BP 206/51 upon arrival, with chest pain and elevated trop suggestive end organ dysfunction  - compliant with medication at home - on diltiazem XR 300mg , hydralazine 50mg  TID, HCTZ 25mg , losartan 100mg  at home  - patient do not wish hydralazine at 50mg  TID (recently changed by nephrology), currently on 25mg  BID, allergic to lisinopril, bradycardia with BBlcoker - recommend adding Imdur 30mg  daily, up-titrate as needed; consider change HCTZ to Chlorthalidone   HLD - continue lipitor and zetia,  will update lipid panel tomorrow   CKD stage III - renal index near baseline  - may gentle hydrate if LHC indicated     Risk Assessment/Risk Scores:       :[id number]   HEAR Score (for undifferentiated chest pain):  HEAR  Score: 5{  New York Heart Association (NYHA) Functional Class NYHA Class I   For questions or updates, please contact [facility] Please consult [email or web address] for contact info under    Signed, Margie Billet, NP  01/01/2021 9:04 AM

## 2021-01-01 NOTE — Progress Notes (Signed)
  Echocardiogram 2D Echocardiogram with 3D and strain has been performed.  Lisa Petty M 01/01/2021, 11:03 AM

## 2021-01-01 NOTE — Progress Notes (Signed)
PROGRESS NOTE    Lisa Petty  BJS:[id number] DOB: 04-07-1936 DOA: 12/31/2020 PCP: [name], MD   No chief complaint on file.   Brief Narrative: 85 year old lady with piror h/o CAD, hypertension,hyperlipidemia, stage 3 a CKD,  presents to ED for chest pain, . Her last Myoview stress test in 03/2020 was negative for ischemia. EKG sinus tachycardia with st t wave abnormalities. troponins mildly elevated. Cardiology consulted for recommendations.   Assessment & Plan:   Principal Problem:   Chest pain Active Problems:   Coronary atherosclerosis of native coronary artery   Hypertensive urgency   CHEST PAIN Resolved.  But pt reports dyspnea on exertion.  Initial troponin mildly elevated, probably demand ischemia from hypertensive urgency. EKG shows non specific st t wave changes.  Echocardiogram ordered and pending.  Cardiology consulted.     Hypertensive urgency:  Improving BP parameters, but not optimal yet.  Currently on Imdur 30 mg daily, losartan 100 mg daily, Cardizem 300 mg daily, hydralazine 25 mg 3 times daily, hydrochlorothiazide 25 mg daily..   H/o CAD S/P pci in 2000 Currently on Plavix, statin.   Hyperlipidemia Continue with Zetia and statin      DVT prophylaxis: Lovenox Code Status: Full code Family Communication: None at bedside  disposition:   Status is: Observation  The patient will require care spanning > 2 midnights and should be moved to inpatient because: Ongoing diagnostic testing needed not appropriate for outpatient work up, Unsafe d/c plan and IV treatments appropriate due to intensity of illness or inability to take PO  Dispo: The patient is from: Home              Anticipated d/c is to: Home              Patient currently is not medically stable to d/c.   Difficult to place patient No       Consultants:   Cardiology  Procedures: Echocardiogram Antimicrobials: None  Subjective: Chest pain  resolved  Objective: Vitals:   01/01/21 0807 01/01/21 0832 01/01/21 1128 01/01/21 1230  BP: (!) 176/76 (!) 200/67 (!) 180/53 (!) 177/47  Pulse: 87 85    Resp:  18    Temp: 97.9 F (36.6 C) 97.7 F (36.5 C)    TempSrc: Oral Oral    SpO2: 99% 100%    Weight:      Height:        Intake/Output Summary (Last 24 hours) at 01/01/2021 1509 Last data filed at 01/01/2021 1458 Gross per 24 hour  Intake 840 ml  Output 1350 ml  Net -510 ml   Filed Weights   12/31/20 1857 01/01/21 0508  Weight: 93.9 kg 94.5 kg    Examination:  General exam: Appears calm and comfortable  Respiratory system: Clear to auscultation. Respiratory effort normal. Cardiovascular system: S1 & S2 heard, RRR. No JVD, murmurs, rubs, gallops or clicks. No pedal edema. Gastrointestinal system: Abdomen is nondistended, soft and nontender. No organomegaly or masses felt. Normal bowel sounds heard. Central nervous system: Alert and oriented. No focal neurological deficits. Extremities: Symmetric 5 x 5 power. Skin: No rashes, lesions or ulcers Psychiatry: Judgement and insight appear normal. Mood & affect appropriate.     Data Reviewed: I have personally reviewed following labs and imaging studies  CBC: Recent Labs  Lab 12/31/20 1854 01/01/21 0046  WBC 10.1 9.4  HGB 11.5* 12.0  HCT 35.9* 36.2  MCV 93.2 90.3  PLT 342 607    Basic Metabolic Panel:  Recent Labs  Lab 12/31/20 1854 01/01/21 0046  NA 134* 136  K 3.7 3.6  CL 105 106  CO2 20* 22  GLUCOSE 164* 121*  BUN 17 17  CREATININE 1.33* 1.08*  CALCIUM 9.5 9.5    GFR: Estimated Creatinine Clearance: 43 mL/min (A) (by C-G formula based on SCr of 1.08 mg/dL (H)).  Liver Function Tests: No results for input(s): AST, ALT, ALKPHOS, BILITOT, PROT, ALBUMIN in the last 168 hours.  CBG: No results for input(s): GLUCAP in the last 168 hours.   Recent Results (from the past 240 hour(s))  SARS CORONAVIRUS 2 (TAT 6-24 HRS) Nasopharyngeal Nasopharyngeal  Swab     Status: None   Collection Time: 12/31/20  9:27 PM   Specimen: Nasopharyngeal Swab  Result Value Ref Range Status   SARS Coronavirus 2 NEGATIVE NEGATIVE Final    Comment: (NOTE) SARS-CoV-2 target nucleic acids are NOT DETECTED.  The SARS-CoV-2 RNA is generally detectable in upper and lower respiratory specimens during the acute phase of infection. Negative results do not preclude SARS-CoV-2 infection, do not rule out co-infections with other pathogens, and should not be used as the sole basis for treatment or other patient management decisions. Negative results must be combined with clinical observations, patient history, and epidemiological information. The expected result is Negative.  Fact Sheet for Patients: SugarRoll.be  Fact Sheet for Healthcare Providers: https://www.woods-mathews.com/  This test is not yet approved or cleared by the [place] FDA and  has been authorized for detection and/or diagnosis of SARS-CoV-2 by FDA under an Emergency Use Authorization (EUA). This EUA will remain  in effect (meaning this test can be used) for the duration of the COVID-19 declaration under Se ction 564(b)(1) of the Act, 21 U.S.C. section 360bbb-3(b)(1), unless the authorization is terminated or revoked sooner.  Performed at [facility], [place] [address] [place], [place] [address]          Radiology Studies: DG Chest 2 View  Result Date: 12/31/2020 CLINICAL DATA:  Chest pain EXAM: CHEST - 2 VIEW COMPARISON:  07/26/2020 FINDINGS: No focal opacity or pleural effusion. Chronic bronchitic changes. Stable cardiomediastinal silhouette with aortic atherosclerosis. No pneumothorax. Calcified breast implants. Calcified nodule in the right upper lobe. IMPRESSION: No active cardiopulmonary disease. Electronically Signed   By: Donavan Foil M.D.   On: 12/31/2020 19:31        Scheduled Meds: . artificial tears  1 application  Both Eyes QHS  . atorvastatin  80 mg Oral QHS  . calcium-vitamin D  1 tablet Oral Q breakfast  . clopidogrel  75 mg Oral Daily  . diltiazem  300 mg Oral Daily  . enoxaparin (LOVENOX) injection  40 mg Subcutaneous Daily  . ezetimibe  10 mg Oral Daily  . hydrALAZINE  25 mg Oral BID  . hydrochlorothiazide  25 mg Oral Daily  . isosorbide mononitrate  30 mg Oral Daily  . losartan  100 mg Oral Daily  . mirabegron ER  25 mg Oral Daily  . sodium chloride  1 drop Both Eyes TID AC & HS  . tobramycin-dexamethasone  1 drop Both Eyes Q4H while awake   Continuous Infusions:   LOS: 0 days    Time spent: 38 minutes.     Hosie Poisson, MD [facility]   To contact the attending provider between 7A-7P or the covering provider during after hours 7P-7A, please log into the web site [email or web address] and access using universal [place] password for that web site. If  you do not have the password, please call the hospital operator.  01/01/2021, 3:09 PM

## 2021-01-01 NOTE — Progress Notes (Signed)
   01/01/21 0133  Vitals  BP (!) 144/56  MAP (mmHg) 84  BP Location Left Arm  BP Method Automatic  Patient Position (if appropriate) Lying  Pulse Rate 75  Level of Consciousness  Level of Consciousness Alert  MEWS COLOR  MEWS Score Color Green  Oxygen Therapy  SpO2 95 %  O2 Device Room Air  MEWS Score  MEWS Temp 0  MEWS Systolic 0  MEWS Pulse 0  MEWS RR 0  MEWS LOC 0  MEWS Score 0  Blood pressure obtained after labetalol 10mg  IV given.

## 2021-01-01 NOTE — Progress Notes (Signed)
Pt's troponin is 104, denies chest pain at this time. Dr. Hal Hope  notified.

## 2021-01-01 NOTE — Plan of Care (Signed)

## 2021-01-02 DIAGNOSIS — I2510 Atherosclerotic heart disease of native coronary artery without angina pectoris: Secondary | ICD-10-CM

## 2021-01-02 DIAGNOSIS — R079 Chest pain, unspecified: Secondary | ICD-10-CM | POA: Diagnosis not present

## 2021-01-02 DIAGNOSIS — I208 Other forms of angina pectoris: Secondary | ICD-10-CM | POA: Diagnosis not present

## 2021-01-02 DIAGNOSIS — I160 Hypertensive urgency: Secondary | ICD-10-CM | POA: Diagnosis not present

## 2021-01-02 DIAGNOSIS — R072 Precordial pain: Secondary | ICD-10-CM | POA: Diagnosis not present

## 2021-01-02 DIAGNOSIS — I10 Essential (primary) hypertension: Secondary | ICD-10-CM | POA: Diagnosis not present

## 2021-01-02 LAB — LIPID PANEL
Cholesterol: 142 mg/dL (ref 0–200)
HDL: 43 mg/dL (ref >40)
LDL Cholesterol: 79 mg/dL (ref 0–99)
Total CHOL/HDL Ratio: 3.3 RATIO
Triglycerides: 100 mg/dL (ref <150)
VLDL: 20 mg/dL (ref 0–40)

## 2021-01-02 LAB — BASIC METABOLIC PANEL
Anion gap: 9 (ref 5–15)
BUN: 13 mg/dL (ref 8–23)
CO2: 22 mmol/L (ref 22–32)
Calcium: 9.4 mg/dL (ref 8.9–10.3)
Chloride: 104 mmol/L (ref 98–111)
Creatinine, Ser: 1.15 mg/dL — ABNORMAL HIGH (ref 0.44–1.00)
GFR, Estimated: 48 mL/min — ABNORMAL LOW (ref >60)
Glucose, Bld: 112 mg/dL — ABNORMAL HIGH (ref 70–99)
Potassium: 3.6 mmol/L (ref 3.5–5.1)
Sodium: 135 mmol/L (ref 135–145)

## 2021-01-02 MED ORDER — NEBIVOLOL HCL 5 MG PO TABS
5.0000 mg | ORAL_TABLET | Freq: Every day | ORAL | Status: DC
  Administered 2021-01-02 – 2021-01-03 (×2): 5 mg via ORAL
  Filled 2021-01-02 (×2): qty 1

## 2021-01-02 NOTE — Progress Notes (Signed)
DAILY PROGRESS NOTE   Patient Name: Lisa Petty Date of Encounter: 01/02/2021 Cardiologist: Larae Grooms, MD  Chief Complaint   No further chest pain  Patient Profile    Lisa Petty is a 85 y.o. female with a hx of CAD s/p LAD stent in 2000, HTN, CKD III, HLD, carotid artery disease with occluded R ICA, PVD, who is being seen 01/01/2021 for the evaluation of chest pain at the request of Dr. Hal Hope.   Subjective   Echo personally reviewed, shows hyperdynamic LVEF >75%, grade 1 DD, no regional WMA's (issue with transferring over to Epic). BP remains poorly controlled. Troponi peaked at 104 and has come down to 61.  She has no more chest pain, wants to go home.  Objective   Vitals:   01/02/21 0401 01/02/21 0523 01/02/21 0624 01/02/21 0915  BP: (!) 164/58 (!) 146/44  (!) 172/51  Pulse: 67 64  72  Resp: 17   17  Temp: 97.6 F (36.4 C)   97.7 F (36.5 C)  TempSrc: Oral   Oral  SpO2: 100%   100%  Weight:   93.8 kg   Height:        Intake/Output Summary (Last 24 hours) at 01/02/2021 1139 Last data filed at 01/02/2021 0500 Gross per 24 hour  Intake 480 ml  Output 1700 ml  Net -1220 ml   Filed Weights   12/31/20 1857 01/01/21 0508 01/02/21 0624  Weight: 93.9 kg 94.5 kg 93.8 kg    Physical Exam   General appearance: alert and no distress Neck: no carotid bruit, no JVD and thyroid not enlarged, symmetric, no tenderness/mass/nodules Lungs: clear to auscultation bilaterally Heart: regular rate and rhythm, S1, S2 normal, no murmur, click, rub or gallop Abdomen: soft, non-tender; bowel sounds normal; no masses,  no organomegaly Extremities: extremities normal, atraumatic, no cyanosis or edema Pulses: 2+ and symmetric Skin: Skin color, texture, turgor normal. No rashes or lesions Neurologic: Grossly normal Psych: Pleasant  Inpatient Medications    Scheduled Meds: . artificial tears  1 application Both Eyes QHS  . atorvastatin  80 mg Oral QHS  .  calcium-vitamin D  1 tablet Oral Q breakfast  . clopidogrel  75 mg Oral Daily  . diltiazem  300 mg Oral Daily  . enoxaparin (LOVENOX) injection  40 mg Subcutaneous Daily  . ezetimibe  10 mg Oral Daily  . hydrALAZINE  25 mg Oral BID  . hydrochlorothiazide  25 mg Oral Daily  . isosorbide mononitrate  30 mg Oral Daily  . losartan  100 mg Oral Daily  . mirabegron ER  25 mg Oral Daily  . sodium chloride  1 drop Both Eyes Q4H    Continuous Infusions:   PRN Meds: acetaminophen **OR** acetaminophen, hydrALAZINE, labetalol, nitroGLYCERIN   Labs   Results for orders placed or performed during the hospital encounter of 12/31/20 (from the past 48 hour(s))  Basic metabolic panel     Status: Abnormal   Collection Time: 12/31/20  6:54 PM  Result Value Ref Range   Sodium 134 (L) 135 - 145 mmol/L   Potassium 3.7 3.5 - 5.1 mmol/L   Chloride 105 98 - 111 mmol/L   CO2 20 (L) 22 - 32 mmol/L   Glucose, Bld 164 (H) 70 - 99 mg/dL    Comment: Glucose reference range applies only to samples taken after fasting for at least 8 hours.   BUN 17 8 - 23 mg/dL   Creatinine, Ser 1.33 (H) 0.44 -  1.00 mg/dL   Calcium 9.5 8.9 - 10.3 mg/dL   GFR, Estimated 40 (L) >60 mL/min    Comment: (NOTE) Calculated using the CKD-EPI Creatinine Equation (2021)    Anion gap 9 5 - 15    Comment: Performed at [facility], [place] [address], [place], [place] [address]  CBC     Status: Abnormal   Collection Time: 12/31/20  6:54 PM  Result Value Ref Range   WBC 10.1 4.0 - 10.5 K/uL   RBC 3.85 (L) 3.87 - 5.11 MIL/uL   Hemoglobin 11.5 (L) 12.0 - 15.0 g/dL   HCT 35.9 (L) 36.0 - 46.0 %   MCV 93.2 80.0 - 100.0 fL   MCH 29.9 26.0 - 34.0 pg   MCHC 32.0 30.0 - 36.0 g/dL   RDW 13.8 11.5 - 15.5 %   Platelets 342 150 - 400 K/uL   nRBC 0.0 0.0 - 0.2 %    Comment: Performed at [facility], [place] [address], [place], [place] [address]  Troponin I (High Sensitivity)     Status: None   Collection Time: 12/31/20  6:54 PM   Result Value Ref Range   Troponin I (High Sensitivity) 15 <18 ng/L    Comment: (NOTE) Elevated high sensitivity troponin I (hsTnI) values and significant  changes across serial measurements may suggest ACS but many other  chronic and acute conditions are known to elevate hsTnI results.  Refer to the "Links" section for chest pain algorithms and additional  guidance. Performed at [facility], [place] [address], [place], [place] [address]   SARS CORONAVIRUS 2 (TAT 6-24 HRS) Nasopharyngeal Nasopharyngeal Swab     Status: None   Collection Time: 12/31/20  9:27 PM   Specimen: Nasopharyngeal Swab  Result Value Ref Range   SARS Coronavirus 2 NEGATIVE NEGATIVE    Comment: (NOTE) SARS-CoV-2 target nucleic acids are NOT DETECTED.  The SARS-CoV-2 RNA is generally detectable in upper and lower respiratory specimens during the acute phase of infection. Negative results do not preclude SARS-CoV-2 infection, do not rule out co-infections with other pathogens, and should not be used as the sole basis for treatment or other patient management decisions. Negative results must be combined with clinical observations, patient history, and epidemiological information. The expected result is Negative.  Fact Sheet for Patients: SugarRoll.be  Fact Sheet for Healthcare Providers: https://www.woods-mathews.com/  This test is not yet approved or cleared by the [place] FDA and  has been authorized for detection and/or diagnosis of SARS-CoV-2 by FDA under an Emergency Use Authorization (EUA). This EUA will remain  in effect (meaning this test can be used) for the duration of the COVID-19 declaration under Se ction 564(b)(1) of the Act, 21 U.S.C. section 360bbb-3(b)(1), unless the authorization is terminated or revoked sooner.  Performed at [facility], [place] [address], [place], [place] [address]   Basic metabolic panel     Status: Abnormal    Collection Time: 01/01/21 12:46 AM  Result Value Ref Range   Sodium 136 135 - 145 mmol/L   Potassium 3.6 3.5 - 5.1 mmol/L   Chloride 106 98 - 111 mmol/L   CO2 22 22 - 32 mmol/L   Glucose, Bld 121 (H) 70 - 99 mg/dL    Comment: Glucose reference range applies only to samples taken after fasting for at least 8 hours.   BUN 17 8 - 23 mg/dL   Creatinine, Ser 1.08 (H) 0.44 - 1.00 mg/dL  Calcium 9.5 8.9 - 10.3 mg/dL   GFR, Estimated 51 (L) >60 mL/min    Comment: (NOTE) Calculated using the CKD-EPI Creatinine Equation (2021)    Anion gap 8 5 - 15    Comment: Performed at [facility], [place] [address] [place], [place] [address]  CBC     Status: None   Collection Time: 01/01/21 12:46 AM  Result Value Ref Range   WBC 9.4 4.0 - 10.5 K/uL   RBC 4.01 3.87 - 5.11 MIL/uL   Hemoglobin 12.0 12.0 - 15.0 g/dL   HCT 36.2 36.0 - 46.0 %   MCV 90.3 80.0 - 100.0 fL   MCH 29.9 26.0 - 34.0 pg   MCHC 33.1 30.0 - 36.0 g/dL   RDW 13.8 11.5 - 15.5 %   Platelets 328 150 - 400 K/uL   nRBC 0.0 0.0 - 0.2 %    Comment: Performed at [facility], [place] [address], [place], [place] [address]  Troponin I (High Sensitivity)     Status: Abnormal   Collection Time: 01/01/21 12:46 AM  Result Value Ref Range   Troponin I (High Sensitivity) 104 (HH) <18 ng/L    Comment: CRITICAL RESULT CALLED TO, READ BACK BY AND VERIFIED WITH: RASUL G,RN 01/01/21 0203 WAYK Performed at [facility], [place] [address], [place], [place] [address]   Magnesium     Status: None   Collection Time: 01/01/21 12:46 AM  Result Value Ref Range   Magnesium 1.7 1.7 - 2.4 mg/dL    Comment: Performed at [facility] [address] [place], [place] [address]  Troponin I (High Sensitivity)     Status: Abnormal   Collection Time: 01/01/21  9:08 AM  Result Value Ref Range   Troponin I (High Sensitivity) 61 (H) <18 ng/L    Comment: (NOTE) Elevated high sensitivity troponin I (hsTnI) values and significant  changes across  serial measurements may suggest ACS but many other  chronic and acute conditions are known to elevate hsTnI results.  Refer to the Links section for chest pain algorithms and additional  guidance. Performed at [facility], [place] [address], [place], [place] [address]   Lipid panel     Status: None   Collection Time: 01/02/21  2:59 AM  Result Value Ref Range   Cholesterol 142 0 - 200 mg/dL   Triglycerides 100 <150 mg/dL   HDL 43 >40 mg/dL   Total CHOL/HDL Ratio 3.3 RATIO   VLDL 20 0 - 40 mg/dL   LDL Cholesterol 79 0 - 99 mg/dL    Comment:        Total Cholesterol/HDL:CHD Risk Coronary Heart Disease Risk Table                     Men   Women  1/2 Average Risk   3.4   3.3  Average Risk       5.0   4.4  2 X Average Risk   9.6   7.1  3 X Average Risk  23.4   11.0        Use the calculated Patient Ratio above and the CHD Risk Table to determine the patient's CHD Risk.        ATP III CLASSIFICATION (LDL):  <100     mg/dL   Optimal  100-129  mg/dL   Near or Above  Optimal  130-159  mg/dL   Borderline  160-189  mg/dL   High  >190     mg/dL   Very High Performed at [facility] [address] [place], [place] [address]   Basic metabolic panel     Status: Abnormal   Collection Time: 01/02/21  2:59 AM  Result Value Ref Range   Sodium 135 135 - 145 mmol/L   Potassium 3.6 3.5 - 5.1 mmol/L   Chloride 104 98 - 111 mmol/L   CO2 22 22 - 32 mmol/L   Glucose, Bld 112 (H) 70 - 99 mg/dL    Comment: Glucose reference range applies only to samples taken after fasting for at least 8 hours.   BUN 13 8 - 23 mg/dL   Creatinine, Ser 1.15 (H) 0.44 - 1.00 mg/dL   Calcium 9.4 8.9 - 10.3 mg/dL   GFR, Estimated 48 (L) >60 mL/min    Comment: (NOTE) Calculated using the CKD-EPI Creatinine Equation (2021)    Anion gap 9 5 - 15    Comment: Performed at [facility] [address] [place], [place] [address]    ECG   N/A  Telemetry   Sinus rhythm -  Personally Reviewed  Radiology    DG Chest 2 View  Result Date: 12/31/2020 CLINICAL DATA:  Chest pain EXAM: CHEST - 2 VIEW COMPARISON:  07/26/2020 FINDINGS: No focal opacity or pleural effusion. Chronic bronchitic changes. Stable cardiomediastinal silhouette with aortic atherosclerosis. No pneumothorax. Calcified breast implants. Calcified nodule in the right upper lobe. IMPRESSION: No active cardiopulmonary disease. Electronically Signed   By: Donavan Foil M.D.   On: 12/31/2020 19:31    Cardiac Studies   Echo reviewed  Assessment   1. Principal Problem: 2.   Chest pain 3. Active Problems: 4.   Coronary atherosclerosis of native coronary artery 5.   Hypertensive urgency 6.   Plan   1. Chest pain has resolved- troponin elevation was minimal, may be consistent with hypertensive urgency. Heart was hyperdynamic on echo without regional WMA's and grade 1 DD. Would recommend better BP control at this point - I guess it is possible that ischemia is driving her hypertension, therefore, would consider repeat outpatient stress testing to look for any significant reversible ischemia. Will not pursue cath at this point. LDL 79 - continue current therapy. Will add low dose Bystolic 5 mg daily - less likely to have bradycardia with this. Possibly close to d/c if BP improved - ?tomorrow.  Time Spent Directly with Patient:  I have spent a total of 25 minutes with the patient reviewing hospital notes, telemetry, EKGs, labs and examining the patient as well as establishing an assessment and plan that was discussed personally with the patient.  > 50% of time was spent in direct patient care.  Length of Stay:  LOS: 0 days   Pixie Casino, MD, [facility], [place] Director of the Advanced Lipid Disorders &  Cardiovascular Risk Reduction Clinic Diplomate of the American Board of Clinical Lipidology Attending Cardiologist  Direct Dial: [phone]  Fax: [phone]   Website:  [email or web address] 01/02/2021, 11:39 AM

## 2021-01-02 NOTE — Progress Notes (Signed)
PROGRESS NOTE    Lisa Petty  GLO:[id number] DOB: 1935-10-29 DOA: 12/31/2020 PCP: [name], MD   No chief complaint on file.   Brief Narrative: 85 year old lady with piror h/o CAD, hypertension,hyperlipidemia, stage 3 a CKD,  presents to ED for chest pain, . Her last Myoview stress test in 03/2020 was negative for ischemia. EKG sinus tachycardia with st t wave abnormalities. troponins mildly elevated. Cardiology consulted for recommendations.   Assessment & Plan:   Principal Problem:   Chest pain Active Problems:   Coronary atherosclerosis of native coronary artery   Hypertensive urgency   CHEST PAIN Resolved.  But pt reports dyspnea on exertion.  Initial troponin mildly elevated, probably demand ischemia from hypertensive urgency. EKG shows non specific st t wave changes.  Echocardiogram ordered and pending.  Cardiology consulted , appreciate recommendations.     Hypertensive urgency:  Improving BP parameters, but not optimal yet.  Currently on Imdur 30 mg daily, losartan 100 mg daily, Cardizem 300 mg daily, hydralazine 25 mg TID, hydrochlorothiazide 25 mg daily.. Bystolic 5 mg added to her regimen today. Recheck blood   H/o CAD S/P pci in 2000 Currently on Plavix, statin.   Hyperlipidemia Continue with Zetia and statin      DVT prophylaxis: Lovenox Code Status: Full code Family Communication: None at bedside  disposition:   Status is: Observation  The patient will require care spanning > 2 midnights and should be moved to inpatient because: Ongoing diagnostic testing needed not appropriate for outpatient work up, Unsafe d/c plan and IV treatments appropriate due to intensity of illness or inability to take PO  Dispo: The patient is from: Home              Anticipated d/c is to: Home              Patient currently is not medically stable to d/c.   Difficult to place patient No       Consultants:   Cardiology  Procedures:  Echocardiogram Antimicrobials: None  Subjective: Chest pain resolved. Sob improved.   Objective: Vitals:   01/02/21 0523 01/02/21 0624 01/02/21 0915 01/02/21 1223  BP: (!) 146/44  (!) 172/51 (!) 139/53  Pulse: 64  72 73  Resp:   17 18  Temp:   97.7 F (36.5 C) 97.8 F (36.6 C)  TempSrc:   Oral Oral  SpO2:   100% 99%  Weight:  93.8 kg    Height:        Intake/Output Summary (Last 24 hours) at 01/02/2021 1417 Last data filed at 01/02/2021 0500 Gross per 24 hour  Intake 480 ml  Output 1700 ml  Net -1220 ml   Filed Weights   12/31/20 1857 01/01/21 0508 01/02/21 0624  Weight: 93.9 kg 94.5 kg 93.8 kg    Examination:  General exam: alert and oriented, comfortable.  Respiratory system: clear to auscultation , no wheezing or rhonchi.  Cardiovascular system: S1 & S2 heard, RRR, no JVD, no pedal edema.  Gastrointestinal system: Abdomen is soft, NT ND BS+ Central nervous system: Alert and oriented, nonfocal  Extremities: No pedal edema.  Skin: No rashes Psychiatry: mood is appropriate.     Data Reviewed: I have personally reviewed following labs and imaging studies  CBC: Recent Labs  Lab 12/31/20 1854 01/01/21 0046  WBC 10.1 9.4  HGB 11.5* 12.0  HCT 35.9* 36.2  MCV 93.2 90.3  PLT 342 188    Basic Metabolic Panel: Recent Labs  Lab  12/31/20 1854 01/01/21 0046 01/02/21 0259  NA 134* 136 135  K 3.7 3.6 3.6  CL 105 106 104  CO2 20* 22 22  GLUCOSE 164* 121* 112*  BUN 17 17 13   CREATININE 1.33* 1.08* 1.15*  CALCIUM 9.5 9.5 9.4  MG  --  1.7  --     GFR: Estimated Creatinine Clearance: 40.2 mL/min (A) (by C-G formula based on SCr of 1.15 mg/dL (H)).  Liver Function Tests: No results for input(s): AST, ALT, ALKPHOS, BILITOT, PROT, ALBUMIN in the last 168 hours.  CBG: No results for input(s): GLUCAP in the last 168 hours.   Recent Results (from the past 240 hour(s))  SARS CORONAVIRUS 2 (TAT 6-24 HRS) Nasopharyngeal Nasopharyngeal Swab     Status: None    Collection Time: 12/31/20  9:27 PM   Specimen: Nasopharyngeal Swab  Result Value Ref Range Status   SARS Coronavirus 2 NEGATIVE NEGATIVE Final    Comment: (NOTE) SARS-CoV-2 target nucleic acids are NOT DETECTED.  The SARS-CoV-2 RNA is generally detectable in upper and lower respiratory specimens during the acute phase of infection. Negative results do not preclude SARS-CoV-2 infection, do not rule out co-infections with other pathogens, and should not be used as the sole basis for treatment or other patient management decisions. Negative results must be combined with clinical observations, patient history, and epidemiological information. The expected result is Negative.  Fact Sheet for Patients: SugarRoll.be  Fact Sheet for Healthcare Providers: https://www.woods-mathews.com/  This test is not yet approved or cleared by the [place] FDA and  has been authorized for detection and/or diagnosis of SARS-CoV-2 by FDA under an Emergency Use Authorization (EUA). This EUA will remain  in effect (meaning this test can be used) for the duration of the COVID-19 declaration under Se ction 564(b)(1) of the Act, 21 U.S.C. section 360bbb-3(b)(1), unless the authorization is terminated or revoked sooner.  Performed at [facility], [place] [address], [place], [place] [address]          Radiology Studies: DG Chest 2 View  Result Date: 12/31/2020 CLINICAL DATA:  Chest pain EXAM: CHEST - 2 VIEW COMPARISON:  07/26/2020 FINDINGS: No focal opacity or pleural effusion. Chronic bronchitic changes. Stable cardiomediastinal silhouette with aortic atherosclerosis. No pneumothorax. Calcified breast implants. Calcified nodule in the right upper lobe. IMPRESSION: No active cardiopulmonary disease. Electronically Signed   By: Donavan Foil M.D.   On: 12/31/2020 19:31        Scheduled Meds: . artificial tears  1 application Both Eyes QHS  .  atorvastatin  80 mg Oral QHS  . calcium-vitamin D  1 tablet Oral Q breakfast  . clopidogrel  75 mg Oral Daily  . diltiazem  300 mg Oral Daily  . enoxaparin (LOVENOX) injection  40 mg Subcutaneous Daily  . ezetimibe  10 mg Oral Daily  . hydrALAZINE  25 mg Oral BID  . hydrochlorothiazide  25 mg Oral Daily  . isosorbide mononitrate  30 mg Oral Daily  . losartan  100 mg Oral Daily  . mirabegron ER  25 mg Oral Daily  . nebivolol  5 mg Oral Daily  . sodium chloride  1 drop Both Eyes Q4H   Continuous Infusions:   LOS: 0 days    Time spent: 38 minutes.     Hosie Poisson, MD [facility]   To contact the attending provider between 7A-7P or the covering provider during after hours 7P-7A, please log into the web site [email or web address] and access using  universal  password for that web site. If you do not have the password, please call the hospital operator.  01/02/2021, 2:17 PM

## 2021-01-02 NOTE — Care Management Obs Status (Signed)
[facility] NOTIFICATION   Patient Details  Name: QUANDRA FEDORCHAK MRN: [id number] Date of Birth: [birth year]   Medicare Observation Status Notification Given:  Yes    Carles Collet, RN 01/02/2021, 8:17 AM

## 2021-01-03 DIAGNOSIS — I160 Hypertensive urgency: Secondary | ICD-10-CM | POA: Diagnosis not present

## 2021-01-03 DIAGNOSIS — R072 Precordial pain: Secondary | ICD-10-CM

## 2021-01-03 DIAGNOSIS — R079 Chest pain, unspecified: Secondary | ICD-10-CM | POA: Diagnosis not present

## 2021-01-03 DIAGNOSIS — I2510 Atherosclerotic heart disease of native coronary artery without angina pectoris: Secondary | ICD-10-CM | POA: Diagnosis not present

## 2021-01-03 DIAGNOSIS — I208 Other forms of angina pectoris: Secondary | ICD-10-CM | POA: Diagnosis not present

## 2021-01-03 DIAGNOSIS — I10 Essential (primary) hypertension: Secondary | ICD-10-CM | POA: Diagnosis not present

## 2021-01-03 MED ORDER — HYDRALAZINE HCL 50 MG PO TABS: 50.0000 mg | ORAL_TABLET | Freq: Three times a day (TID) | ORAL | Status: DC

## 2021-01-03 MED ORDER — NEBIVOLOL HCL 5 MG PO TABS: 5.0000 mg | ORAL_TABLET | Freq: Every day | ORAL | 2 refills | Status: DC

## 2021-01-03 MED ORDER — MAGNESIUM SULFATE 2 GM/50ML IV SOLN: 2.0000 g | Freq: Once | INTRAVENOUS | Status: DC

## 2021-01-03 MED ORDER — ISOSORBIDE MONONITRATE ER 30 MG PO TB24: 30.0000 mg | ORAL_TABLET | Freq: Every day | ORAL | 2 refills | Status: DC

## 2021-01-03 NOTE — Progress Notes (Addendum)
DAILY PROGRESS NOTE   Patient Name: Lisa Petty Date of Encounter: 01/03/2021 Cardiologist: Larae Grooms, MD  Chief Complaint   No further chest pain  Patient Profile   Lisa Petty is a 85 y.o. female with a hx of CAD s/p LAD stent in 2000, HTN, CKD III, HLD, carotid artery disease with occluded R ICA, PVD, who is being seen 01/01/2021 for the evaluation of chest pain at the request of Dr. Hal Hope.   Subjective   Blood pressure is somewhat better yesterday/overnight - cannot increase further d/t risk of worsening diastolic hypotension. No evidence for AI on echo. HR in the 60-70 range. Noted to have cramping in her hands and side today.  Objective   Vitals:   01/02/21 1930 01/03/21 0024 01/03/21 0432 01/03/21 0811  BP: (!) 141/55 (!) 159/52 (!) 159/50 (!) 162/55  Pulse: 67 67 62 71  Resp: 16 16 18 18   Temp: 98.2 F (36.8 C) 98.3 F (36.8 C) 98.2 F (36.8 C) 98.2 F (36.8 C)  TempSrc: Oral Oral Oral Oral  SpO2: 95% 99% 99% 98%  Weight:  94 kg    Height:        Intake/Output Summary (Last 24 hours) at 01/03/2021 0936 Last data filed at 01/03/2021 4401 Gross per 24 hour  Intake 840 ml  Output 1300 ml  Net -460 ml   Filed Weights   01/01/21 0508 01/02/21 0624 01/03/21 0024  Weight: 94.5 kg 93.8 kg 94 kg    Physical Exam   General appearance: alert and no distress Neck: no carotid bruit, no JVD and thyroid not enlarged, symmetric, no tenderness/mass/nodules Lungs: clear to auscultation bilaterally Heart: regular rate and rhythm, S1, S2 normal, no murmur, click, rub or gallop Abdomen: soft, non-tender; bowel sounds normal; no masses,  no organomegaly Extremities: extremities normal, atraumatic, no cyanosis or edema Pulses: 2+ and symmetric Skin: Skin color, texture, turgor normal. No rashes or lesions Neurologic: Grossly normal Psych: Pleasant  Inpatient Medications    Scheduled Meds: . artificial tears  1 application Both Eyes QHS  .  atorvastatin  80 mg Oral QHS  . calcium-vitamin D  1 tablet Oral Q breakfast  . clopidogrel  75 mg Oral Daily  . diltiazem  300 mg Oral Daily  . enoxaparin (LOVENOX) injection  40 mg Subcutaneous Daily  . ezetimibe  10 mg Oral Daily  . hydrALAZINE  50 mg Oral Q8H  . hydrochlorothiazide  25 mg Oral Daily  . isosorbide mononitrate  30 mg Oral Daily  . losartan  100 mg Oral Daily  . mirabegron ER  25 mg Oral Daily  . nebivolol  5 mg Oral Daily  . sodium chloride  1 drop Both Eyes Q4H    Continuous Infusions:   PRN Meds: acetaminophen **OR** acetaminophen, hydrALAZINE, labetalol, nitroGLYCERIN   Labs   Results for orders placed or performed during the hospital encounter of 12/31/20 (from the past 48 hour(s))  Lipid panel     Status: None   Collection Time: 01/02/21  2:59 AM  Result Value Ref Range   Cholesterol 142 0 - 200 mg/dL   Triglycerides 100 <150 mg/dL   HDL 43 >40 mg/dL   Total CHOL/HDL Ratio 3.3 RATIO   VLDL 20 0 - 40 mg/dL   LDL Cholesterol 79 0 - 99 mg/dL    Comment:        Total Cholesterol/HDL:CHD Risk Coronary Heart Disease Risk Table  Men   Women  1/2 Average Risk   3.4   3.3  Average Risk       5.0   4.4  2 X Average Risk   9.6   7.1  3 X Average Risk  23.4   11.0        Use the calculated Patient Ratio above and the CHD Risk Table to determine the patient's CHD Risk.        ATP III CLASSIFICATION (LDL):  <100     mg/dL   Optimal  100-129  mg/dL   Near or Above                    Optimal  130-159  mg/dL   Borderline  160-189  mg/dL   High  >190     mg/dL   Very High Performed at [facility] [address], [place], [place] [address]   Basic metabolic panel     Status: Abnormal   Collection Time: 01/02/21  2:59 AM  Result Value Ref Range   Sodium 135 135 - 145 mmol/L   Potassium 3.6 3.5 - 5.1 mmol/L   Chloride 104 98 - 111 mmol/L   CO2 22 22 - 32 mmol/L   Glucose, Bld 112 (H) 70 - 99 mg/dL    Comment: Glucose  reference range applies only to samples taken after fasting for at least 8 hours.   BUN 13 8 - 23 mg/dL   Creatinine, Ser 1.15 (H) 0.44 - 1.00 mg/dL   Calcium 9.4 8.9 - 10.3 mg/dL   GFR, Estimated 48 (L) >60 mL/min    Comment: (NOTE) Calculated using the CKD-EPI Creatinine Equation (2021)    Anion gap 9 5 - 15    Comment: Performed at [facility] [address] [place], [place] [address]    ECG   N/A  Telemetry   Sinus rhythm - Personally Reviewed  Radiology    No results found.  Cardiac Studies   Echo reviewed  Assessment   Principal Problem:   Chest pain Active Problems:   Coronary atherosclerosis of native coronary artery   Hypertensive urgency   Plan   BP appears improved - added Bystolic 5 mg daily yesterday- would not further increase hydralazine and she had significant tachycardia. Also, BP control limited by low diastolic pressures.  She has had no further chest pain. Ok from a cardiac standpoint to d/c home today. We will arrange for a 2 week follow-up with APP at [facility] [address]. Cramps in her hands and side today d/t low magnesium - was 1.7, will give 2G IV magnesium today.  Time Spent Directly with Patient:  I have spent a total of 25 minutes with the patient reviewing hospital notes, telemetry, EKGs, labs and examining the patient as well as establishing an assessment and plan that was discussed personally with the patient.  > 50% of time was spent in direct patient care.   Length of Stay:  LOS: 0 days   Pixie Casino, MD, [facility], [place] Director of the Advanced Lipid Disorders &  Cardiovascular Risk Reduction Clinic Diplomate of the American Board of Clinical Lipidology Attending Cardiologist  Direct Dial: [phone]  Fax: [phone]  Website:  [email or web address] 01/03/2021, 9:36 AM

## 2021-01-03 NOTE — Plan of Care (Signed)

## 2021-01-03 NOTE — Progress Notes (Signed)
D/C instructions given and reviewed. Tele and IV's removed, tolerated well. Volunteers notified pt is ready to be transported [place].

## 2021-01-03 NOTE — Plan of Care (Signed)

## 2021-01-03 NOTE — Plan of Care (Signed)

## 2021-01-03 NOTE — Discharge Summary (Signed)
Physician Discharge Summary  Lisa Petty [id number] DOB: Oct 19, 1935 DOA: 12/31/2020  PCP: [name], MD  Admit date: 12/31/2020 Discharge date: 01/03/2021  Admitted From: Home.  Disposition: HOme.   Recommendations for Outpatient Follow-up:  1. Follow up with PCP in 1-2 weeks 2. Please obtain BMP/CBC in one week Please follow up with cardiology as recommended.   Discharge Condition:stable.  CODE STATUS: full code.  Diet recommendation: Heart Healthy  Brief/Interim Summary:  85 year old lady with piror h/o CAD, hypertension,hyperlipidemia, stage 3 a CKD,  presents to ED for chest pain, . Her last Myoview stress test in 03/2020 was negative for ischemia. EKG sinus tachycardia with st t wave abnormalities. troponins mildly elevated. Cardiology consulted for recommendations.    Discharge Diagnoses:  Principal Problem:   Chest pain Active Problems:   Coronary atherosclerosis of native coronary artery   Hypertensive urgency    CHEST PAIN Resolved.  But pt reports dyspnea on exertion.  Initial troponin mildly elevated, probably demand ischemia from hypertensive urgency. EKG shows non specific st t wave changes.  Echocardiogram reviewed.  Cardiology consulted , appreciate recommendations.     Hypertensive urgency:  Improving BP parameters, but not optimal yet.  Currently on Imdur 30 mg daily, losartan 100 mg daily, Cardizem 300 mg daily, hydralazine 25 mg TID, hydrochlorothiazide 25 mg daily.. Bystolic 5 mg added to her regimen.    H/o CAD S/P pci in 2000 Currently on Plavix, statin.   Hyperlipidemia Continue with Zetia and statin Discharge Instructions  Discharge Instructions    Diet - low sodium heart healthy   Complete by: As directed    Discharge instructions   Complete by: As directed    Please follow up with PCP in one week.  Follow up with cardiology as recommended.     Allergies as of 01/03/2021      Reactions   Lisinopril Cough       Medication List    STOP taking these medications   gentamicin-prednisoLONE 0.3-1 % ophthalmic drops     TAKE these medications   atorvastatin 80 MG tablet Commonly known as: LIPITOR Take 80 mg by mouth at bedtime.   Calcium Carbonate-Vitamin D 600-400 MG-UNIT tablet Take 1 tablet by mouth daily.   clopidogrel 75 MG tablet Commonly known as: PLAVIX Take 1 tablet by mouth once daily   diltiazem 300 MG 24 hr capsule Commonly known as: CARDIZEM CD Take 300 mg by mouth daily.   ezetimibe 10 MG tablet Commonly known as: ZETIA Take 1 tablet by mouth once daily   hydrALAZINE 25 MG tablet Commonly known as: APRESOLINE Take 50 mg by mouth 3 (three) times daily.   hydrochlorothiazide 25 MG tablet Commonly known as: HYDRODIURIL Take 1 tablet by mouth daily.   isosorbide mononitrate 30 MG 24 hr tablet Commonly known as: IMDUR Take 1 tablet (30 mg total) by mouth daily. Start taking on: Jan 04, 2021   losartan 100 MG tablet Commonly known as: COZAAR Take 100 mg by mouth daily.   mirabegron ER 25 MG Tb24 tablet Commonly known as: MYRBETRIQ Take 25 mg by mouth daily.   MURO 128 OP Apply 4 drops to eye 4 (four) times daily.   sodium chloride 5 % ophthalmic ointment Commonly known as: MURO 0000000 Place 1 application into both eyes at bedtime.   nebivolol 5 MG tablet Commonly known as: BYSTOLIC Take 1 tablet (5 mg total) by mouth daily. Start taking on: Jan 04, 2021   nitroGLYCERIN 0.4 MG SL  tablet Commonly known as: NITROSTAT Place 1 tablet (0.4 mg total) under the tongue every 5 (five) minutes as needed for chest pain.       Follow-up Information    [name], MD Follow up.   Specialty: Internal Medicine Why: Office will call with folluw up appointment Contact information: [address] [place] [address] [phone]        Loel Dubonnet, NP Follow up.   Specialty: Cardiology Why: Hospital follow-up with Cardiology  scheduled for 01/31/2021 at 11:15am with Laurann Montana, one of Dr. Hassell Done NPs. Please arrive 15 minutes early for check-in. If this date/time does not work for you, please call our office to reschedule. Contact information: [address] [place] [address] [phone]              Allergies  Allergen Reactions  . Lisinopril Cough    Consultations:  Cardiology.    Procedures/Studies: DG Chest 2 View  Result Date: 12/31/2020 CLINICAL DATA:  Chest pain EXAM: CHEST - 2 VIEW COMPARISON:  07/26/2020 FINDINGS: No focal opacity or pleural effusion. Chronic bronchitic changes. Stable cardiomediastinal silhouette with aortic atherosclerosis. No pneumothorax. Calcified breast implants. Calcified nodule in the right upper lobe. IMPRESSION: No active cardiopulmonary disease. Electronically Signed   By: Donavan Foil M.D.   On: 12/31/2020 19:31      Subjective: No new complaints.   Discharge Exam: Vitals:   01/03/21 0432 01/03/21 0811  BP: (!) 159/50 (!) 162/55  Pulse: 62 71  Resp: 18 18  Temp: 98.2 F (36.8 C) 98.2 F (36.8 C)  SpO2: 99% 98%   Vitals:   01/02/21 1930 01/03/21 0024 01/03/21 0432 01/03/21 0811  BP: (!) 141/55 (!) 159/52 (!) 159/50 (!) 162/55  Pulse: 67 67 62 71  Resp: 16 16 18 18   Temp: 98.2 F (36.8 C) 98.3 F (36.8 C) 98.2 F (36.8 C) 98.2 F (36.8 C)  TempSrc: Oral Oral Oral Oral  SpO2: 95% 99% 99% 98%  Weight:  94 kg    Height:        General: Pt is alert, awake, not in acute distress Cardiovascular: RRR, S1/S2 +, no rubs, no gallops Respiratory: CTA bilaterally, no wheezing, no rhonchi Abdominal: Soft, NT, ND, bowel sounds + Extremities: no edema, no cyanosis    The results of significant diagnostics from this hospitalization (including imaging, microbiology, ancillary and laboratory) are listed below for reference.     Microbiology: Recent Results (from the past 240 hour(s))  SARS CORONAVIRUS 2 (TAT 6-24 HRS)  Nasopharyngeal Nasopharyngeal Swab     Status: None   Collection Time: 12/31/20  9:27 PM   Specimen: Nasopharyngeal Swab  Result Value Ref Range Status   SARS Coronavirus 2 NEGATIVE NEGATIVE Final    Comment: (NOTE) SARS-CoV-2 target nucleic acids are NOT DETECTED.  The SARS-CoV-2 RNA is generally detectable in upper and lower respiratory specimens during the acute phase of infection. Negative results do not preclude SARS-CoV-2 infection, do not rule out co-infections with other pathogens, and should not be used as the sole basis for treatment or other patient management decisions. Negative results must be combined with clinical observations, patient history, and epidemiological information. The expected result is Negative.  Fact Sheet for Patients: SugarRoll.be  Fact Sheet for Healthcare Providers: https://www.woods-mathews.com/  This test is not yet approved or cleared by the [place] FDA and  has been authorized for detection and/or diagnosis of SARS-CoV-2 by FDA under an Emergency Use Authorization (  EUA). This EUA will remain  in effect (meaning this test can be used) for the duration of the COVID-19 declaration under Se ction 564(b)(1) of the Act, 21 U.S.C. section 360bbb-3(b)(1), unless the authorization is terminated or revoked sooner.  Performed at [facility], [place] [address] [place], [place] [address]      Labs: BNP (last 3 results) Recent Labs    03/04/20 1538  BNP 06.2   Basic Metabolic Panel: Recent Labs  Lab 12/31/20 1854 01/01/21 0046 01/02/21 0259  NA 134* 136 135  K 3.7 3.6 3.6  CL 105 106 104  CO2 20* 22 22  GLUCOSE 164* 121* 112*  BUN 17 17 13   CREATININE 1.33* 1.08* 1.15*  CALCIUM 9.5 9.5 9.4  MG  --  1.7  --    Liver Function Tests: No results for input(s): AST, ALT, ALKPHOS, BILITOT, PROT, ALBUMIN in the last 168 hours. No results for input(s): LIPASE, AMYLASE in the last 168  hours. No results for input(s): AMMONIA in the last 168 hours. CBC: Recent Labs  Lab 12/31/20 1854 01/01/21 0046  WBC 10.1 9.4  HGB 11.5* 12.0  HCT 35.9* 36.2  MCV 93.2 90.3  PLT 342 328   Cardiac Enzymes: No results for input(s): CKTOTAL, CKMB, CKMBINDEX, TROPONINI in the last 168 hours. BNP: Invalid input(s): POCBNP CBG: No results for input(s): GLUCAP in the last 168 hours. D-Dimer No results for input(s): DDIMER in the last 72 hours. Hgb A1c No results for input(s): HGBA1C in the last 72 hours. Lipid Profile Recent Labs    01/02/21 0259  CHOL 142  HDL 43  LDLCALC 79  TRIG 100  CHOLHDL 3.3   Thyroid function studies No results for input(s): TSH, T4TOTAL, T3FREE, THYROIDAB in the last 72 hours.  Invalid input(s): FREET3 Anemia work up No results for input(s): VITAMINB12, FOLATE, FERRITIN, TIBC, IRON, RETICCTPCT in the last 72 hours. Urinalysis    Component Value Date/Time   COLORURINE COLORLESS (A) 03/04/2020 1819   APPEARANCEUR CLEAR 03/04/2020 1819   LABSPEC 1.003 (L) 03/04/2020 1819   PHURINE 7.0 03/04/2020 1819   GLUCOSEU NEGATIVE 03/04/2020 1819   HGBUR SMALL (A) 03/04/2020 1819   BILIRUBINUR NEGATIVE 03/04/2020 1819   KETONESUR NEGATIVE 03/04/2020 1819   PROTEINUR NEGATIVE 03/04/2020 1819   NITRITE NEGATIVE 03/04/2020 1819   LEUKOCYTESUR NEGATIVE 03/04/2020 1819   Sepsis Labs Invalid input(s): PROCALCITONIN,  WBC,  LACTICIDVEN Microbiology Recent Results (from the past 240 hour(s))  SARS CORONAVIRUS 2 (TAT 6-24 HRS) Nasopharyngeal Nasopharyngeal Swab     Status: None   Collection Time: 12/31/20  9:27 PM   Specimen: Nasopharyngeal Swab  Result Value Ref Range Status   SARS Coronavirus 2 NEGATIVE NEGATIVE Final    Comment: (NOTE) SARS-CoV-2 target nucleic acids are NOT DETECTED.  The SARS-CoV-2 RNA is generally detectable in upper and lower respiratory specimens during the acute phase of infection. Negative results do not preclude SARS-CoV-2  infection, do not rule out co-infections with other pathogens, and should not be used as the sole basis for treatment or other patient management decisions. Negative results must be combined with clinical observations, patient history, and epidemiological information. The expected result is Negative.  Fact Sheet for Patients: SugarRoll.be  Fact Sheet for Healthcare Providers: https://www.woods-mathews.com/  This test is not yet approved or cleared by the [place] FDA and  has been authorized for detection and/or diagnosis of SARS-CoV-2 by FDA under an Emergency Use Authorization (EUA). This EUA will remain  in effect (  meaning this test can be used) for the duration of the COVID-19 declaration under Se ction 564(b)(1) of the Act, 21 U.S.C. section 360bbb-3(b)(1), unless the authorization is terminated or revoked sooner.  Performed at [facility], [place] [address] [place], [place] [address]      Time coordinating discharge: 35 minutes.  SIGNED:   Hosie Poisson, MD  [facility] 01/03/2021, 5:09 PM

## 2021-01-04 LAB — ECHOCARDIOGRAM COMPLETE
Area-P 1/2: 2.99 cm2
Height: 62 in
S' Lateral: 2.7 cm
Weight: 3334.4 oz

## 2021-01-06 DIAGNOSIS — I2510 Atherosclerotic heart disease of native coronary artery without angina pectoris: Secondary | ICD-10-CM | POA: Diagnosis not present

## 2021-01-06 DIAGNOSIS — I10 Essential (primary) hypertension: Secondary | ICD-10-CM | POA: Diagnosis not present

## 2021-01-18 DIAGNOSIS — H18513 Endothelial corneal dystrophy, bilateral: Secondary | ICD-10-CM | POA: Diagnosis not present

## 2021-01-31 ENCOUNTER — Encounter: Payer: Self-pay | Admitting: Family

## 2021-01-31 ENCOUNTER — Other Ambulatory Visit: Payer: Self-pay

## 2021-01-31 ENCOUNTER — Ambulatory Visit: Payer: PPO | Admitting: Family

## 2021-01-31 VITALS — BP 146/52 | HR 63 | Ht 63.5 in | Wt 208.8 lb

## 2021-01-31 DIAGNOSIS — I10 Essential (primary) hypertension: Secondary | ICD-10-CM

## 2021-01-31 DIAGNOSIS — I6523 Occlusion and stenosis of bilateral carotid arteries: Secondary | ICD-10-CM | POA: Diagnosis not present

## 2021-01-31 DIAGNOSIS — N1832 Chronic kidney disease, stage 3b: Secondary | ICD-10-CM | POA: Diagnosis not present

## 2021-01-31 MED ORDER — ISOSORBIDE MONONITRATE ER 30 MG PO TB24: 30.0000 mg | ORAL_TABLET | Freq: Every day | ORAL | 5 refills | Status: AC

## 2021-01-31 MED ORDER — NEBIVOLOL HCL 5 MG PO TABS: 5.0000 mg | ORAL_TABLET | Freq: Every day | ORAL | 5 refills | Status: DC

## 2021-01-31 NOTE — Progress Notes (Signed)
Office Visit    Patient Name: Lisa Petty Date of Encounter: 01/31/2021  PCP:  [name], [place]  Cardiologist:  Larae Grooms, MD  Advanced Practice Provider:  No care team member to display Electrophysiologist:  None   Chief Complaint    Lisa Petty is a 85 y.o. female with a hx of carotid artery disease with occluded right carotid artery, coronary artery disease s/p DES to LAD 2000, lower extremity edema, chronic exertional dyspnea, hyperlipidemia, bradycardia  presents today for hospital follow up.   Past Medical History    Past Medical History:  Diagnosis Date   CKD (chronic kidney disease), stage III (HCC)    Coronary atherosclerosis of native coronary artery    s/p stent to LAD 2000-no MI, right ICA 100% stenosis;left side followed by Dr. Irish Lack   Essential hypertension, benign    Low back pain    Mixed hyperlipidemia    Occlusion and stenosis of carotid artery without mention of cerebral infarction    Osteopenia    mild to normal (BMD 08/18/09 normal)   Overactive bladder    on detrol    Vitamin D deficiency    repleted by [facility]   Past Surgical History:  Procedure Laterality Date   AUGMENTATION MAMMAPLASTY Bilateral 1976    Allergies  Allergies  Allergen Reactions   Lisinopril Cough    History of Present Illness    Lisa Petty is a 85 y.o. female with a hx of carotid artery disease with occluded right carotid artery, coronary artery disease s/p DES to LAD 2000, lower extremity edema, chronic exertional dyspnea, hyperlipidemia, bradycardia last seen while hospitalized.  Last Myoview stress test August 2021 with no evidence of ischemia.  She wore Holter monitor in 2021 due to symptomatic bradycardia showing sinus rhythm with rare PACs, PVC with average heart rate 60 bpm.  Her atenolol dose was reduced.  She was last seen in clinic 12/27/2020 by Dr. Irish Lack doing overall well from cardiac  perspective.  Her atenolol was discontinued due to bradycardia.  Hospitalized 12/31/2020 - 01/03/2021 due to chest pain and dyspnea on exertion.  Initial troponin mildly elevated but determined to be demand from hypertension.  EKG with nonspecific ST/T wave changes.  Bystolic was added.  Echo 12/18/2019 LVEF greater than 75%, no R WMA, mild LVH, grade 1 diastolic dysfunction, RV normal size and function, trivial MR.  She presents today for follow-up with her son.  Reports no recurrent chest pain, pressure, tightness since hospital discharge.  Reports her shortness of breath has resolved and her dyspnea on exertion is stable at her baseline.  She has been checking her home blood pressure 3 times per day.  Taking majority of her medications 8 AM, hydralazine at 2 PM, and hydralazine again around 9 PM.  Her blood pressure is best controlled around 2 PM in the afternoon with average reading approximately 130s over 60s.  EKGs/Labs/Other Studies Reviewed:   The following studies were reviewed today:  Echo 01/01/21 1. Left ventricular ejection fraction, by estimation, is >75%. The left  ventricle has hyperdynamic function. The left ventricle has no regional  wall motion abnormalities. There is mild left ventricular hypertrophy.  Left ventricular diastolic parameters  are consistent with Grade I diastolic dysfunction (impaired relaxation).   2. Right ventricular systolic function is normal. The right ventricular  size is normal.   3. The mitral valve is abnormal. Trivial mitral valve regurgitation.   4. The  aortic valve is tricuspid. Aortic valve regurgitation is not  visualized.  Carotid duplex 06/2020 Summary:  Right Carotid: Evidence consistent with a total occlusion of the right  ICA.                Non-hemodynamically significant plaque <50% noted in the  CCA. The                 ECA appears >50% stenosed.   Left Carotid: Velocities in the left ICA are consistent with a 1-39%  stenosis.    Vertebrals: Bilateral vertebral arteries demonstrate antegrade flow.  Subclavians: Normal flow hemodynamics were seen in bilateral subclavian               arteries.  EKG:  No EKG today.   Recent Labs: 03/04/2020: B Natriuretic Peptide 77.7 01/01/2021: Hemoglobin 12.0; Magnesium 1.7; Platelets 328 01/02/2021: BUN 13; Creatinine, Ser 1.15; Potassium 3.6; Sodium 135  Recent Lipid Panel    Component Value Date/Time   CHOL 142 01/02/2021 0259   CHOL 153 06/19/2018 1128   TRIG 100 01/02/2021 0259   HDL 43 01/02/2021 0259   HDL 50 06/19/2018 1128   CHOLHDL 3.3 01/02/2021 0259   VLDL 20 01/02/2021 0259   LDLCALC 79 01/02/2021 0259   LDLCALC 80 06/19/2018 1128   Home Medications   Current Meds  Medication Sig   atorvastatin (LIPITOR) 80 MG tablet Take 80 mg by mouth at bedtime.   Calcium Carbonate-Vitamin D 600-400 MG-UNIT tablet Take 1 tablet by mouth daily.    clopidogrel (PLAVIX) 75 MG tablet Take 1 tablet by mouth once daily   diltiazem (CARDIZEM CD) 300 MG 24 hr capsule Take 300 mg by mouth daily.   ezetimibe (ZETIA) 10 MG tablet Take 1 tablet by mouth once daily   hydrALAZINE (APRESOLINE) 25 MG tablet Take 50 mg by mouth 3 (three) times daily.   hydrochlorothiazide (HYDRODIURIL) 25 MG tablet Take 1 tablet by mouth daily.   isosorbide mononitrate (IMDUR) 30 MG 24 hr tablet Take 1 tablet (30 mg total) by mouth daily.   losartan (COZAAR) 100 MG tablet Take 100 mg by mouth daily.   mirabegron ER (MYRBETRIQ) 25 MG TB24 tablet Take 25 mg by mouth daily.   nebivolol (BYSTOLIC) 5 MG tablet Take 1 tablet (5 mg total) by mouth daily.   nitroGLYCERIN (NITROSTAT) 0.4 MG SL tablet Place 1 tablet (0.4 mg total) under the tongue every 5 (five) minutes as needed for chest pain.   sodium chloride (MURO 128) 5 % ophthalmic ointment Place 1 application into both eyes at bedtime.   Sodium Chloride, Hypertonic, (MURO 128 OP) Apply 4 drops to eye 4 (four) times daily.     Review of Systems   All  other systems reviewed and are otherwise negative except as noted above.  Physical Exam    VS:  BP (!) 146/52   Pulse 63   Ht 5' 3.5" (1.613 m)   Wt 208 lb 12.8 oz (94.7 kg)   SpO2 97%   BMI 36.41 kg/m  , BMI Body mass index is 36.41 kg/m.  Wt Readings from Last 3 Encounters:  01/31/21 208 lb 12.8 oz (94.7 kg)  01/03/21 207 lb 3.7 oz (94 kg)  12/27/20 207 lb (93.9 kg)    GEN: Well nourished, overweight, well developed, in no acute distress. HEENT: normal. Neck: Supple, no JVD, carotid bruits, or masses. Cardiac: RRR, no murmurs, rubs, or gallops. No clubbing, cyanosis, edema.  Radials/DP/PT 2+ and equal bilaterally.  Respiratory:  Respirations regular and unlabored, clear to auscultation bilaterally. GI: Soft, nontender, nondistended. MS: No deformity or atrophy. Skin: Warm and dry, no rash. Neuro:  Strength and sensation are intact. Psych: Normal affect.  Assessment & Plan    CAD- No chest pain.  No indication for ischemic evaluation.  GDMT includes Plavix, Imdur, atorvastatin, Zetia. Imdur refill provided. Heart healthy diet and regular cardiovascular exercise encouraged.   Carotid artery disease- Repeat duplex 06/2021 for monitoring.  Continue statin, Plavix, Zetia.  Hyperlipidemia-continue Zetia 10 mg daily and atorvastatin 80 mg daily.  Denies myalgias.  Lower extremity edema-no edema on exam today.  Well-controlled on hydrochlorothiazide 25 mg daily.  Low-salt diet and leg elevation encouraged.  Hypertension- BP overall well controlled by home monitoring. Continue Bystolic 5mg  QD, Imdur 30mg  QD, Diltiazem 300mg  QD, HCTZ 25mg  QD, Losartan 100mg  QD, Hydralazine 50mg  TID. Encouraged to check once per day at home at least 2 hours after medications. Home monitoring shows average SBP in the 130s. Titration of antihypertensive regimen complicated by bradycardia, low diastolic blood pressure. Heart healthy diet and regular cardiovascular exercise encouraged.    Bradycardia-asymptomatic with no lightheadedness, dizziness, near syncope 1 nor syncope.  Careful titration of AV nodal blocking agents.  Continue Bystolic 5 mg daily, diltiazem 300 mg daily.  Disposition: Follow up in 3 months with Dr. Irish Lack or APP  Signed, Loel Dubonnet, NP 01/31/2021, 11:15 AM [place]

## 2021-01-31 NOTE — Patient Instructions (Addendum)
Medication Instructions:  Continue your current medications.   *If you need a refill on your cardiac medications before your next appointment, please call your pharmacy*   Lab Work: None ordered today.   If you have labs (blood work) drawn today and your tests are completely normal, you will receive your results only by: [place] (if you have MyChart) OR A paper copy in the mail If you have any lab test that is abnormal or we need to change your treatment, we will call you to review the results.   Testing/Procedures: None ordered today. Your echocardiogram in the hospital looked good!   Follow-Up: At [facility], you and your health needs are our priority.  As part of our continuing mission to provide you with exceptional heart care, we have created designated Provider Care Teams.  These Care Teams include your primary Cardiologist (physician) and Advanced Practice Providers (APPs -  Physician Assistants and Nurse Practitioners) who all work together to provide you with the care you need, when you need it.  We recommend signing up for the patient portal called "MyChart".  Sign up information is provided on this After Visit Summary.  MyChart is used to connect with patients for Virtual Visits (Telemedicine).  Patients are able to view lab/test results, encounter notes, upcoming appointments, etc.  Non-urgent messages can be sent to your provider as well.   To learn more about what you can do with MyChart, go to [email or web address].    Your next appointment:   3 month(s)  The format for your next appointment:   In Person  05/20/2021 ARRIVE AT 10:45  Provider:   You may see Larae Grooms, MD or one of the following Advanced Practice Providers on your designated Care Team:   Melina Copa, PA-C Ermalinda Barrios, PA-C   Other Instructions  Heart Healthy Diet Recommendations: A low-salt diet is recommended. Meats should be grilled, baked, or boiled. Avoid fried foods.  Focus on lean protein sources like fish or chicken with vegetables and fruits. The American Heart Association is a Microbiologist!  American Heart Association Diet and Lifeystyle Recommendations   Exercise recommendations: The American Heart Association recommends 150 minutes of moderate intensity exercise weekly. Try 30 minutes of moderate intensity exercise 4-5 times per week. This could include walking, jogging, or swimming.

## 2021-02-08 DIAGNOSIS — H18513 Endothelial corneal dystrophy, bilateral: Secondary | ICD-10-CM | POA: Diagnosis not present

## 2021-02-09 DIAGNOSIS — N3946 Mixed incontinence: Secondary | ICD-10-CM | POA: Diagnosis not present

## 2021-02-10 DIAGNOSIS — R7303 Prediabetes: Secondary | ICD-10-CM | POA: Diagnosis not present

## 2021-02-10 DIAGNOSIS — R600 Localized edema: Secondary | ICD-10-CM | POA: Diagnosis not present

## 2021-02-10 DIAGNOSIS — N1831 Chronic kidney disease, stage 3a: Secondary | ICD-10-CM | POA: Diagnosis not present

## 2021-02-10 DIAGNOSIS — I129 Hypertensive chronic kidney disease with stage 1 through stage 4 chronic kidney disease, or unspecified chronic kidney disease: Secondary | ICD-10-CM | POA: Diagnosis not present

## 2021-03-01 DIAGNOSIS — H18513 Endothelial corneal dystrophy, bilateral: Secondary | ICD-10-CM | POA: Diagnosis not present

## 2021-03-29 DIAGNOSIS — H00021 Hordeolum internum right upper eyelid: Secondary | ICD-10-CM | POA: Diagnosis not present

## 2021-04-07 DIAGNOSIS — G894 Chronic pain syndrome: Secondary | ICD-10-CM | POA: Diagnosis not present

## 2021-04-07 DIAGNOSIS — N1831 Chronic kidney disease, stage 3a: Secondary | ICD-10-CM | POA: Diagnosis not present

## 2021-04-07 DIAGNOSIS — M1710 Unilateral primary osteoarthritis, unspecified knee: Secondary | ICD-10-CM | POA: Diagnosis not present

## 2021-04-07 DIAGNOSIS — I10 Essential (primary) hypertension: Secondary | ICD-10-CM | POA: Diagnosis not present

## 2021-04-13 DIAGNOSIS — M1711 Unilateral primary osteoarthritis, right knee: Secondary | ICD-10-CM | POA: Diagnosis not present

## 2021-04-13 DIAGNOSIS — M1712 Unilateral primary osteoarthritis, left knee: Secondary | ICD-10-CM | POA: Diagnosis not present

## 2021-04-13 DIAGNOSIS — M25562 Pain in left knee: Secondary | ICD-10-CM | POA: Diagnosis not present

## 2021-04-13 DIAGNOSIS — M25561 Pain in right knee: Secondary | ICD-10-CM | POA: Diagnosis not present

## 2021-04-19 DIAGNOSIS — H18513 Endothelial corneal dystrophy, bilateral: Secondary | ICD-10-CM | POA: Diagnosis not present

## 2021-05-02 DIAGNOSIS — M25561 Pain in right knee: Secondary | ICD-10-CM | POA: Diagnosis not present

## 2021-05-17 DIAGNOSIS — H18513 Endothelial corneal dystrophy, bilateral: Secondary | ICD-10-CM | POA: Diagnosis not present

## 2021-05-19 NOTE — Progress Notes (Signed)
Cardiology Office Note   Date:  05/20/2021   ID:  Lisa Petty, DOB 04/08/1936, MRN [id number]  PCP:  Lisa Cha, MD    No chief complaint on file.  CAD  Wt Readings from Last 3 Encounters:  05/20/21 205 lb 6.4 oz (93.2 kg)  01/31/21 208 lb 12.8 oz (94.7 kg)  01/03/21 207 lb 3.7 oz (94 kg)       History of Present Illness: Lisa Petty is a 85 y.o. female   who has an occluded carotid artery.  She also had an LAD stent in 2000.     Left leg edema is chronically worse than the right leg although she cannot remember any injury.   She gets her carotid scan annually.   She has chronic DOE.  Walking is limited by back pain.     "11/19:The left ventricular ejection fraction is hyperdynamic (>65%). Nuclear stress EF: 67%. There was no ST segment deviation noted during stress. The study is normal.   1. EF 67%, normal wall motion.  2. No significant perfusion defects (using upright stress images).  No evidence for ischemia or infarction.    Normal study. "   Bradycardia was an issue in 2021: "Bradycardia with heart rates in the 40s associated with dizziness and presyncope seen in the ED 03/04/2020.  Atenolol was decreased from 100 mg daily to 50 mg daily.  She is also on diltiazem 240 mg daily.  No longer symptomatic.  She is not checking her blood pressure pulse regularly at home.  Will place Holter monitor to see if we need to adjust her medications further."   Holter in 2021 showed: "Sinus rhythm with rare PACS, PVCs and short runs of PACs. Minimum HR 41 bpm at 2:36 PM. Average HR 60 bpm.   No clear indication of rhythm that necessitates pacemaker; but would have to take symptoms into consideration. "  Normal stress test in 03/2020.   Denies : Chest pain. Dizziness. Leg edema. Nitroglycerin use. Orthopnea. Palpitations. Paroxysmal nocturnal dyspnea. Shortness of breath. Syncope.    Past Medical History:  Diagnosis Date   CKD (chronic kidney disease),  stage III (HCC)    Coronary atherosclerosis of native coronary artery    s/p stent to LAD 2000-no MI, right ICA 100% stenosis;left side followed by Dr. Irish Petty   Essential hypertension, benign    Low back pain    Mixed hyperlipidemia    Occlusion and stenosis of carotid artery without mention of cerebral infarction    Osteopenia    mild to normal (BMD 08/18/09 normal)   Overactive bladder    on detrol    Vitamin D deficiency    repleted by [facility]    Past Surgical History:  Procedure Laterality Date   AUGMENTATION MAMMAPLASTY Bilateral 1976     Current Outpatient Medications  Medication Sig Dispense Refill   atorvastatin (LIPITOR) 80 MG tablet Take 80 mg by mouth at bedtime.     Calcium Carbonate-Vitamin D 600-400 MG-UNIT tablet Take 1 tablet by mouth daily.      clopidogrel (PLAVIX) 75 MG tablet Take 1 tablet by mouth once daily 90 tablet 3   diltiazem (CARDIZEM CD) 300 MG 24 hr capsule Take 300 mg by mouth daily.     ezetimibe (ZETIA) 10 MG tablet Take 1 tablet by mouth once daily 90 tablet 1   hydrALAZINE (APRESOLINE) 25 MG tablet Take 50 mg by mouth 3 (three) times daily.     hydrochlorothiazide (HYDRODIURIL) 25  MG tablet Take 1 tablet by mouth daily.     isosorbide mononitrate (IMDUR) 30 MG 24 hr tablet Take 1 tablet (30 mg total) by mouth daily. 30 tablet 5   losartan (COZAAR) 100 MG tablet Take 100 mg by mouth daily.     mirabegron ER (MYRBETRIQ) 25 MG TB24 tablet Take 25 mg by mouth daily.     nebivolol (BYSTOLIC) 5 MG tablet Take 1 tablet (5 mg total) by mouth daily. 30 tablet 5   nitroGLYCERIN (NITROSTAT) 0.4 MG SL tablet Place 1 tablet (0.4 mg total) under the tongue every 5 (five) minutes as needed for chest pain. 25 tablet 1   sodium chloride (MURO 128) 5 % ophthalmic ointment Place 1 application into both eyes at bedtime.     Sodium Chloride, Hypertonic, (MURO 128 OP) Apply 4 drops to eye 4 (four) times daily.     No current facility-administered medications for this  visit.    Allergies:   Lisinopril    Social History:  The patient  reports that she has never smoked. She has never used smokeless tobacco. She reports that she does not drink alcohol.   Family History:  The patient's family history includes CAD in her brother, father, and son; Dementia in her mother; Diabetes in her sister; Heart attack in her brother, father, and son; Lung cancer in her father.    ROS:  Please see the history of present illness.   Otherwise, review of systems are positive for joint pains.   All other systems are reviewed and negative.    PHYSICAL EXAM: VS:  BP (!) 144/60   Pulse 73   Ht 5' 3.5" (1.613 m)   Wt 205 lb 6.4 oz (93.2 kg)   SpO2 97%   BMI 35.81 kg/m  , BMI Body mass index is 35.81 kg/m. GEN: Well nourished, well developed, in no acute distress HEENT: normal Neck: no JVD, carotid bruits, or masses Cardiac: RRR; no murmurs, rubs, or gallops,no edema  Respiratory:  clear to auscultation bilaterally, normal work of breathing GI: soft, nontender, nondistended, + BS MS: no deformity or atrophy Skin: warm and dry, no rash Neuro:  Strength and sensation are intact Psych: euthymic mood, full affect      Recent Labs: 01/01/2021: Hemoglobin 12.0; Magnesium 1.7; Platelets 328 01/02/2021: BUN 13; Creatinine, Ser 1.15; Potassium 3.6; Sodium 135   Lipid Panel    Component Value Date/Time   CHOL 142 01/02/2021 0259   CHOL 153 06/19/2018 1128   TRIG 100 01/02/2021 0259   HDL 43 01/02/2021 0259   HDL 50 06/19/2018 1128   CHOLHDL 3.3 01/02/2021 0259   VLDL 20 01/02/2021 0259   LDLCALC 79 01/02/2021 0259   LDLCALC 80 06/19/2018 1128     Other studies Reviewed: Additional studies/ records that were reviewed today with results demonstrating: Labs reviewed.  Hospital records reviewed   ASSESSMENT AND PLAN:  CAD: No angina. Negative stress test in 2021.  Continue aggressive secondary prevention. Carotid artery disease:  followed by [facility]. Known  occluded ICA.  Hyperlipidemia: LDL 79 in 12/2020 Hypertension: She is on multiple medications for blood pressure.  Home blood pressure readings are still in the 150s to 170s on occasion.  She did not tolerate 50 of hydralazine 3 times daily due to apparent reflex tachycardia.  She also has slow heart rates at home, with her heart rate dropping into the 40s.  Bystolic was added in May when she was in the hospital for hypertensive urgency.  Due to her age and some of the slow heart rates, I am concerned that she could develop symptoms.  We will stop diltiazem.  Start amlodipine 10 mg daily.  Hopefully this will help control her blood pressure and her heart rate will no longer drop into the 40s.  If she feels worse, she will go back to the diltiazem and hold the amlodipine.  Continue ARB. Bradycardia: No symptoms as of yet, but I would like to try to keep her heart rate in the low normal range. Lower extremity swelling: Elevate legs.  Mild venous insufficiency.    Current medicines are reviewed at length with the patient today.  The patient concerns regarding her medicines were addressed.  The following changes have been made:  No change  Labs/ tests ordered today include:  No orders of the defined types were placed in this encounter.   Recommend 150 minutes/week of aerobic exercise Low fat, low carb, high fiber diet recommended  Disposition:   FU in 1 year with me or sooner if the need arises. We will have her follow-up in our Pharm.D. hypertension clinic in 2 to 3 weeks.  She should not bring her blood pressure cuff with her.   Signed, Lisa Grooms, MD  05/20/2021 11:44 AM    [facility] [place], [place], [place]  [address] Phone: [phone]; Fax: [phone]

## 2021-05-20 ENCOUNTER — Ambulatory Visit: Payer: PPO | Admitting: Interventional Cardiology

## 2021-05-20 ENCOUNTER — Other Ambulatory Visit: Payer: Self-pay

## 2021-05-20 ENCOUNTER — Encounter: Payer: Self-pay | Admitting: Interventional Cardiology

## 2021-05-20 ENCOUNTER — Other Ambulatory Visit: Payer: Self-pay | Admitting: Interventional Cardiology

## 2021-05-20 VITALS — BP 144/60 | HR 73 | Ht 63.5 in | Wt 205.4 lb

## 2021-05-20 DIAGNOSIS — I2510 Atherosclerotic heart disease of native coronary artery without angina pectoris: Secondary | ICD-10-CM | POA: Diagnosis not present

## 2021-05-20 DIAGNOSIS — M7989 Other specified soft tissue disorders: Secondary | ICD-10-CM

## 2021-05-20 DIAGNOSIS — I6523 Occlusion and stenosis of bilateral carotid arteries: Secondary | ICD-10-CM

## 2021-05-20 DIAGNOSIS — R001 Bradycardia, unspecified: Secondary | ICD-10-CM

## 2021-05-20 DIAGNOSIS — I10 Essential (primary) hypertension: Secondary | ICD-10-CM

## 2021-05-20 DIAGNOSIS — E782 Mixed hyperlipidemia: Secondary | ICD-10-CM

## 2021-05-20 MED ORDER — AMLODIPINE BESYLATE 10 MG PO TABS: 10.0000 mg | ORAL_TABLET | Freq: Every day | ORAL | 6 refills | Status: DC

## 2021-05-20 NOTE — Patient Instructions (Addendum)
Medication Instructions:  Your physician has recommended you make the following change in your medication: Stop diltiazem Start Amlodipine 10 mg by mouth daily  *If you need a refill on your cardiac medications before your next appointment, please call your pharmacy*   Lab Work: none If you have labs (blood work) drawn today and your tests are completely normal, you will receive your results only by: [place] (if you have MyChart) OR A paper copy in the mail If you have any lab test that is abnormal or we need to change your treatment, we will call you to review the results.   Testing/Procedures: none   Follow-Up: At [facility], you and your health needs are our priority.  As part of our continuing mission to provide you with exceptional heart care, we have created designated Provider Care Teams.  These Care Teams include your primary Cardiologist (physician) and Advanced Practice Providers (APPs -  Physician Assistants and Nurse Practitioners) who all work together to provide you with the care you need, when you need it.  We recommend signing up for the patient portal called "MyChart".  Sign up information is provided on this After Visit Summary.  MyChart is used to connect with patients for Virtual Visits (Telemedicine).  Patients are able to view lab/test results, encounter notes, upcoming appointments, etc.  Non-urgent messages can be sent to your provider as well.   To learn more about what you can do with MyChart, go to [email or web address].    Your next appointment:   12 month(s)  The format for your next appointment:   In Person  Provider:   You may see Larae Grooms, MD or one of the following Advanced Practice Providers on your designated Care Team:   Melina Copa, PA-C Ermalinda Barrios, PA-C   Other Instructions  You have been referred to the hypertension clinic in our office.  Please schedule new patient appointment for 2-3 weeks from now  Please  check blood pressure and heart rate at home.  Keep record of readings and bring to appointment in hypertension clinic.  Also bring your blood pressure cuff to this appointment

## 2021-05-24 DIAGNOSIS — H18513 Endothelial corneal dystrophy, bilateral: Secondary | ICD-10-CM | POA: Diagnosis not present

## 2021-05-25 DIAGNOSIS — J029 Acute pharyngitis, unspecified: Secondary | ICD-10-CM | POA: Diagnosis not present

## 2021-05-25 DIAGNOSIS — J069 Acute upper respiratory infection, unspecified: Secondary | ICD-10-CM | POA: Diagnosis not present

## 2021-05-25 DIAGNOSIS — U071 COVID-19: Secondary | ICD-10-CM | POA: Diagnosis not present

## 2021-05-30 DIAGNOSIS — N1832 Chronic kidney disease, stage 3b: Secondary | ICD-10-CM | POA: Diagnosis not present

## 2021-05-30 DIAGNOSIS — E782 Mixed hyperlipidemia: Secondary | ICD-10-CM | POA: Diagnosis not present

## 2021-05-30 DIAGNOSIS — M170 Bilateral primary osteoarthritis of knee: Secondary | ICD-10-CM | POA: Diagnosis not present

## 2021-05-30 DIAGNOSIS — E785 Hyperlipidemia, unspecified: Secondary | ICD-10-CM | POA: Diagnosis not present

## 2021-05-30 DIAGNOSIS — I2510 Atherosclerotic heart disease of native coronary artery without angina pectoris: Secondary | ICD-10-CM | POA: Diagnosis not present

## 2021-05-30 DIAGNOSIS — I10 Essential (primary) hypertension: Secondary | ICD-10-CM | POA: Diagnosis not present

## 2021-05-30 DIAGNOSIS — G8929 Other chronic pain: Secondary | ICD-10-CM | POA: Diagnosis not present

## 2021-06-06 NOTE — Progress Notes (Signed)
Patient ID: Lisa Petty                 DOB: 04/05/1936                      MRN: [id number]     HPI: Lisa Petty is a 85 y.o. female referred by Dr. Irish Lack to HTN clinic. PMH is significant for CAD s/p DES to LAD in 2000, total occlusion of right ICA and 1-39% stenosis in left ICA, HTN, CKD III, HLD, chronic LE edema and DOE, and bradycardia. Normal stress test in 03/2020. Most recent echo 5/22 showed EF >75% and G1DD.   Patient was seen in the ED 02/2020 due to bradycardia - HR in the 40s associated with dizziness and presyncope. Atenol was decreased from 100mg  to 50mg  daily, eventually held due to bradycardia. Holter monitor showed sinus rhythm with rare PACs, PVCs and short runs of PACs. Pt has difficult to control BP complicated by hx of dizzy spells with presyncopal episodes and home HR in the 40s. Presented to ED 12/2020 with hypertensive urgency and was started on Bystolic.  She was last seen by Dr. Irish Lack on 10/7. At that visit, patient's BP was 144/60 and HR was 73 in clinic, but pt reported home HR in the 40s. Pt was started on amlodipine 10mg  daily and diltiazem was discontinued.  Pt presents today with her husband. She had COVID a few weeks ago, still has a lingering cough. Reports tolerating her medications well. No SOB or LE edema out of the ordinary. Taking lower dose of hydralazine - 25mg  not 50mg  as our med list reports. Takes all her daily meds at 9am. Had a sausage biscuit and diet Coke this AM. Brings in home bicep BP cuff and log today. Has had cuff for past 3 years, home reading 140/59 in office compared to manual reading of 118/54. Reports they've brought cuff to other offices who also noted a 4mmHg discrepancy in systolic reading. Other home readings since amlodipine was started a few weeks ago include: 140/63, 144/68, 139/69, 148/70, 135/56, 158/75. Previously in the 694-854O systolic, now in the 270-350K more. Low 131/58, high 148/70. HR 60s-80. If subtracting 93GHWE from  systolic readings, then home readings are in the 993Z-169C systolic.  Current HTN meds:  Amlodipine 10mg  daily Bystolic 5mg  daily Imdur 30 mg daily Hydralazine 25mg  TID-9am, 2pm, 10pm HCTZ 25 mg daily Losartan 100 mg daily  Previously tried: hydralazine 50mg  TID (reflex tachycardia), atenolol 100 mg (bradycardia), diltiazem 240-300 mg daily (bradycardia)  BP goal: <130/7mmHg  Family History: family history includes CAD in her brother, father, and son; Dementia in her mother; Diabetes in her sister; Heart attack in her brother, father, and son; Lung cancer in her father.  Social History:  reports that she has never smoked. She has never used smokeless tobacco. She reports that she does not drink alcohol.  Diet: sausage biscuit this morning; soup or sandwich for lunch; salad, veggies for dinner, sometimes has stir fry. Drinks water, some Diet Coke (1-1.5 cans a day)   Exercise: Walking limited by back and knee pain  Wt Readings from Last 3 Encounters:  05/20/21 205 lb 6.4 oz (93.2 kg)  01/31/21 208 lb 12.8 oz (94.7 kg)  01/03/21 207 lb 3.7 oz (94 kg)   BP Readings from Last 3 Encounters:  05/20/21 (!) 144/60  01/31/21 (!) 146/52  01/03/21 (!) 162/55   Pulse Readings from Last 3 Encounters:  05/20/21 73  01/31/21 63  01/03/21 71    Renal function: CrCl cannot be calculated (Patient's most recent lab result is older than the maximum 21 days allowed.).  Past Medical History:  Diagnosis Date   CKD (chronic kidney disease), stage III (HCC)    Coronary atherosclerosis of native coronary artery    s/p stent to LAD 2000-no MI, right ICA 100% stenosis;left side followed by Dr. Irish Lack   Essential hypertension, benign    Low back pain    Mixed hyperlipidemia    Occlusion and stenosis of carotid artery without mention of cerebral infarction    Osteopenia    mild to normal (BMD 08/18/09 normal)   Overactive bladder    on detrol    Vitamin D deficiency    repleted by ASC[facility]     Current Outpatient Medications on File Prior to Visit  Medication Sig Dispense Refill   amLODipine (NORVASC) 10 MG tablet Take 1 tablet (10 mg total) by mouth daily. 30 tablet 6   atorvastatin (LIPITOR) 80 MG tablet Take 80 mg by mouth at bedtime.     Calcium Carbonate-Vitamin D 600-400 MG-UNIT tablet Take 1 tablet by mouth daily.      clopidogrel (PLAVIX) 75 MG tablet Take 1 tablet by mouth once daily 90 tablet 3   ezetimibe (ZETIA) 10 MG tablet Take 1 tablet by mouth once daily 90 tablet 3   hydrALAZINE (APRESOLINE) 25 MG tablet Take 50 mg by mouth 3 (three) times daily.     hydrochlorothiazide (HYDRODIURIL) 25 MG tablet Take 1 tablet by mouth daily.     isosorbide mononitrate (IMDUR) 30 MG 24 hr tablet Take 1 tablet (30 mg total) by mouth daily. 30 tablet 5   losartan (COZAAR) 100 MG tablet Take 100 mg by mouth daily.     mirabegron ER (MYRBETRIQ) 25 MG TB24 tablet Take 25 mg by mouth daily.     nebivolol (BYSTOLIC) 5 MG tablet Take 1 tablet (5 mg total) by mouth daily. 30 tablet 5   nitroGLYCERIN (NITROSTAT) 0.4 MG SL tablet Place 1 tablet (0.4 mg total) under the tongue every 5 (five) minutes as needed for chest pain. 25 tablet 1   sodium chloride (MURO 128) 5 % ophthalmic ointment Place 1 application into both eyes at bedtime.     Sodium Chloride, Hypertonic, (MURO 128 OP) Apply 4 drops to eye 4 (four) times daily.     No current facility-administered medications on file prior to visit.    Allergies  Allergen Reactions   Lisinopril Cough    There were no vitals taken for this visit.   Assessment/Plan:  1. Hypertension - BP improved notably and now at goal < 130/38mmHg since replacing diltiazem with amlodipine a few weeks ago. Home BP cuff overestimates systolic BP by 50KXFG. Using adjusted BP readings, all home readings at goal as well with no episodes of bradycardia noted. Will continue current meds including amlodipine 10mg  daily, Bystolic 5mg  daily, hydralazine 25mg  TID,  HCTZ 25mg  daily, losartan 100mg  daily, and Imdur 30mg  daily. Advised pt to start checking BP a few hours after taking AM meds instead of 30 mins after. F/u with PharmD as needed.  Denasia Venn E. Dalante Minus, PharmD, BCACP, [place] [address] [place], [place] [address] Phone: [phone]; Fax: [phone] 06/07/2021 11:04 AM

## 2021-06-07 ENCOUNTER — Ambulatory Visit (INDEPENDENT_AMBULATORY_CARE_PROVIDER_SITE_OTHER): Payer: PPO | Admitting: Pharmacist

## 2021-06-07 ENCOUNTER — Other Ambulatory Visit: Payer: Self-pay

## 2021-06-07 VITALS — BP 118/54 | HR 69

## 2021-06-07 DIAGNOSIS — I10 Essential (primary) hypertension: Secondary | ICD-10-CM

## 2021-06-07 NOTE — Patient Instructions (Addendum)
Look for something containing dextromethorphan over the counter to help with your cough  Continue taking your current medications, your blood pressure is at goal < 130/61mmHg  Keep in mind that your cuff reads ~44YJEH higher systolic than our clinic reading  Call clinic with any medication concerns

## 2021-06-09 DIAGNOSIS — M47816 Spondylosis without myelopathy or radiculopathy, lumbar region: Secondary | ICD-10-CM | POA: Diagnosis not present

## 2021-06-09 DIAGNOSIS — N1831 Chronic kidney disease, stage 3a: Secondary | ICD-10-CM | POA: Diagnosis not present

## 2021-06-09 DIAGNOSIS — I10 Essential (primary) hypertension: Secondary | ICD-10-CM | POA: Diagnosis not present

## 2021-06-09 DIAGNOSIS — I2510 Atherosclerotic heart disease of native coronary artery without angina pectoris: Secondary | ICD-10-CM | POA: Diagnosis not present

## 2021-06-09 DIAGNOSIS — Z23 Encounter for immunization: Secondary | ICD-10-CM | POA: Diagnosis not present

## 2021-06-21 DIAGNOSIS — H18513 Endothelial corneal dystrophy, bilateral: Secondary | ICD-10-CM | POA: Diagnosis not present

## 2021-06-23 DIAGNOSIS — H00021 Hordeolum internum right upper eyelid: Secondary | ICD-10-CM | POA: Diagnosis not present

## 2021-06-29 ENCOUNTER — Ambulatory Visit (HOSPITAL_COMMUNITY)
Admission: RE | Admit: 2021-06-29 | Discharge: 2021-06-29 | Disposition: A | Discharge status: Home / Self Care | Payer: PPO | Source: Ambulatory Visit | Attending: Cardiovascular Disease | Admitting: Cardiovascular Disease

## 2021-06-29 ENCOUNTER — Other Ambulatory Visit: Payer: Self-pay

## 2021-06-29 DIAGNOSIS — I6523 Occlusion and stenosis of bilateral carotid arteries: Secondary | ICD-10-CM | POA: Diagnosis not present

## 2021-07-05 ENCOUNTER — Other Ambulatory Visit: Payer: Self-pay | Admitting: *Deleted

## 2021-07-05 DIAGNOSIS — I6523 Occlusion and stenosis of bilateral carotid arteries: Secondary | ICD-10-CM

## 2021-07-12 DIAGNOSIS — H00021 Hordeolum internum right upper eyelid: Secondary | ICD-10-CM | POA: Diagnosis not present

## 2021-07-18 DIAGNOSIS — N1832 Chronic kidney disease, stage 3b: Secondary | ICD-10-CM | POA: Diagnosis not present

## 2021-07-26 DIAGNOSIS — R7303 Prediabetes: Secondary | ICD-10-CM | POA: Diagnosis not present

## 2021-07-26 DIAGNOSIS — R600 Localized edema: Secondary | ICD-10-CM | POA: Diagnosis not present

## 2021-07-26 DIAGNOSIS — I129 Hypertensive chronic kidney disease with stage 1 through stage 4 chronic kidney disease, or unspecified chronic kidney disease: Secondary | ICD-10-CM | POA: Diagnosis not present

## 2021-07-26 DIAGNOSIS — N1831 Chronic kidney disease, stage 3a: Secondary | ICD-10-CM | POA: Diagnosis not present

## 2021-07-27 DIAGNOSIS — N1832 Chronic kidney disease, stage 3b: Secondary | ICD-10-CM | POA: Diagnosis not present

## 2021-07-27 DIAGNOSIS — M170 Bilateral primary osteoarthritis of knee: Secondary | ICD-10-CM | POA: Diagnosis not present

## 2021-07-27 DIAGNOSIS — E782 Mixed hyperlipidemia: Secondary | ICD-10-CM | POA: Diagnosis not present

## 2021-07-27 DIAGNOSIS — I10 Essential (primary) hypertension: Secondary | ICD-10-CM | POA: Diagnosis not present

## 2021-07-27 DIAGNOSIS — G8929 Other chronic pain: Secondary | ICD-10-CM | POA: Diagnosis not present

## 2021-08-09 DIAGNOSIS — H04123 Dry eye syndrome of bilateral lacrimal glands: Secondary | ICD-10-CM | POA: Diagnosis not present

## 2021-08-18 ENCOUNTER — Telehealth: Payer: Self-pay | Admitting: *Deleted

## 2021-08-18 NOTE — Telephone Encounter (Signed)
Patient notified

## 2021-08-18 NOTE — Telephone Encounter (Signed)
Recent office note received from [facility].  Nebivolol decreased to 2.5 mg daily at this visit.  Note reviewed by Dr Irish Lack who recommends patient continue to monitor BP and heart rate at home.  Let us know if slow heart rate continues.  I placed call to patient and left message to call office.

## 2021-08-18 NOTE — Telephone Encounter (Signed)
Patient is returning call.  °

## 2021-08-30 DIAGNOSIS — H18513 Endothelial corneal dystrophy, bilateral: Secondary | ICD-10-CM | POA: Diagnosis not present

## 2021-09-13 DIAGNOSIS — H18513 Endothelial corneal dystrophy, bilateral: Secondary | ICD-10-CM | POA: Diagnosis not present

## 2021-09-17 ENCOUNTER — Other Ambulatory Visit: Payer: Self-pay | Admitting: Interventional Cardiology

## 2021-09-20 DIAGNOSIS — H43393 Other vitreous opacities, bilateral: Secondary | ICD-10-CM | POA: Diagnosis not present

## 2021-09-20 DIAGNOSIS — H40033 Anatomical narrow angle, bilateral: Secondary | ICD-10-CM | POA: Diagnosis not present

## 2021-09-22 ENCOUNTER — Other Ambulatory Visit: Payer: Self-pay | Admitting: Internal Medicine

## 2021-09-22 DIAGNOSIS — Z1231 Encounter for screening mammogram for malignant neoplasm of breast: Secondary | ICD-10-CM

## 2021-10-11 ENCOUNTER — Other Ambulatory Visit: Payer: Self-pay | Admitting: Internal Medicine

## 2021-10-11 ENCOUNTER — Ambulatory Visit
Admission: RE | Admit: 2021-10-11 | Discharge: 2021-10-11 | Disposition: A | Discharge status: Home / Self Care | Payer: PPO | Source: Ambulatory Visit | Attending: Internal Medicine | Admitting: Internal Medicine

## 2021-10-11 DIAGNOSIS — Z1231 Encounter for screening mammogram for malignant neoplasm of breast: Secondary | ICD-10-CM | POA: Diagnosis not present

## 2021-10-11 DIAGNOSIS — H18513 Endothelial corneal dystrophy, bilateral: Secondary | ICD-10-CM | POA: Diagnosis not present

## 2021-10-19 DIAGNOSIS — E782 Mixed hyperlipidemia: Secondary | ICD-10-CM | POA: Diagnosis not present

## 2021-10-19 DIAGNOSIS — I10 Essential (primary) hypertension: Secondary | ICD-10-CM | POA: Diagnosis not present

## 2021-10-25 DIAGNOSIS — H18513 Endothelial corneal dystrophy, bilateral: Secondary | ICD-10-CM | POA: Diagnosis not present

## 2021-11-15 DIAGNOSIS — H18513 Endothelial corneal dystrophy, bilateral: Secondary | ICD-10-CM | POA: Diagnosis not present

## 2021-11-16 DIAGNOSIS — I4891 Unspecified atrial fibrillation: Secondary | ICD-10-CM | POA: Diagnosis not present

## 2021-11-16 DIAGNOSIS — H9193 Unspecified hearing loss, bilateral: Secondary | ICD-10-CM | POA: Diagnosis not present

## 2021-11-16 DIAGNOSIS — I509 Heart failure, unspecified: Secondary | ICD-10-CM | POA: Diagnosis not present

## 2021-11-16 DIAGNOSIS — I25118 Atherosclerotic heart disease of native coronary artery with other forms of angina pectoris: Secondary | ICD-10-CM | POA: Diagnosis not present

## 2021-11-16 DIAGNOSIS — N1831 Chronic kidney disease, stage 3a: Secondary | ICD-10-CM | POA: Diagnosis not present

## 2021-11-16 DIAGNOSIS — I130 Hypertensive heart and chronic kidney disease with heart failure and stage 1 through stage 4 chronic kidney disease, or unspecified chronic kidney disease: Secondary | ICD-10-CM | POA: Diagnosis not present

## 2021-11-16 DIAGNOSIS — E785 Hyperlipidemia, unspecified: Secondary | ICD-10-CM | POA: Diagnosis not present

## 2021-11-16 DIAGNOSIS — D6869 Other thrombophilia: Secondary | ICD-10-CM | POA: Diagnosis not present

## 2021-11-16 DIAGNOSIS — G8929 Other chronic pain: Secondary | ICD-10-CM | POA: Diagnosis not present

## 2021-11-16 DIAGNOSIS — I25119 Atherosclerotic heart disease of native coronary artery with unspecified angina pectoris: Secondary | ICD-10-CM | POA: Diagnosis not present

## 2021-11-16 DIAGNOSIS — I10 Essential (primary) hypertension: Secondary | ICD-10-CM | POA: Diagnosis not present

## 2021-12-09 DIAGNOSIS — H00024 Hordeolum internum left upper eyelid: Secondary | ICD-10-CM | POA: Diagnosis not present

## 2021-12-13 DIAGNOSIS — H00024 Hordeolum internum left upper eyelid: Secondary | ICD-10-CM | POA: Diagnosis not present

## 2021-12-24 ENCOUNTER — Other Ambulatory Visit: Payer: Self-pay | Admitting: Interventional Cardiology

## 2021-12-28 DIAGNOSIS — I25118 Atherosclerotic heart disease of native coronary artery with other forms of angina pectoris: Secondary | ICD-10-CM | POA: Diagnosis not present

## 2021-12-28 DIAGNOSIS — Z0000 Encounter for general adult medical examination without abnormal findings: Secondary | ICD-10-CM | POA: Diagnosis not present

## 2021-12-28 DIAGNOSIS — Z1331 Encounter for screening for depression: Secondary | ICD-10-CM | POA: Diagnosis not present

## 2021-12-28 DIAGNOSIS — I10 Essential (primary) hypertension: Secondary | ICD-10-CM | POA: Diagnosis not present

## 2021-12-28 DIAGNOSIS — M47816 Spondylosis without myelopathy or radiculopathy, lumbar region: Secondary | ICD-10-CM | POA: Diagnosis not present

## 2021-12-28 DIAGNOSIS — I779 Disorder of arteries and arterioles, unspecified: Secondary | ICD-10-CM | POA: Diagnosis not present

## 2021-12-28 DIAGNOSIS — M1710 Unilateral primary osteoarthritis, unspecified knee: Secondary | ICD-10-CM | POA: Diagnosis not present

## 2021-12-28 DIAGNOSIS — F411 Generalized anxiety disorder: Secondary | ICD-10-CM | POA: Diagnosis not present

## 2021-12-28 DIAGNOSIS — N1831 Chronic kidney disease, stage 3a: Secondary | ICD-10-CM | POA: Diagnosis not present

## 2022-01-03 DIAGNOSIS — H18513 Endothelial corneal dystrophy, bilateral: Secondary | ICD-10-CM | POA: Diagnosis not present

## 2022-01-10 DIAGNOSIS — M5451 Vertebrogenic low back pain: Secondary | ICD-10-CM | POA: Diagnosis not present

## 2022-01-24 DIAGNOSIS — H18513 Endothelial corneal dystrophy, bilateral: Secondary | ICD-10-CM | POA: Diagnosis not present

## 2022-01-26 DIAGNOSIS — I10 Essential (primary) hypertension: Secondary | ICD-10-CM | POA: Diagnosis not present

## 2022-01-26 DIAGNOSIS — M549 Dorsalgia, unspecified: Secondary | ICD-10-CM | POA: Diagnosis not present

## 2022-01-26 DIAGNOSIS — E7800 Pure hypercholesterolemia, unspecified: Secondary | ICD-10-CM | POA: Diagnosis not present

## 2022-01-26 DIAGNOSIS — Z79899 Other long term (current) drug therapy: Secondary | ICD-10-CM | POA: Diagnosis not present

## 2022-01-26 DIAGNOSIS — Z0130 Encounter for examination of blood pressure without abnormal findings: Secondary | ICD-10-CM | POA: Diagnosis not present

## 2022-01-26 DIAGNOSIS — G8929 Other chronic pain: Secondary | ICD-10-CM | POA: Diagnosis not present

## 2022-01-26 DIAGNOSIS — M4306 Spondylolysis, lumbar region: Secondary | ICD-10-CM | POA: Diagnosis not present

## 2022-01-31 DIAGNOSIS — E7800 Pure hypercholesterolemia, unspecified: Secondary | ICD-10-CM | POA: Diagnosis not present

## 2022-01-31 DIAGNOSIS — Z0130 Encounter for examination of blood pressure without abnormal findings: Secondary | ICD-10-CM | POA: Diagnosis not present

## 2022-01-31 DIAGNOSIS — I10 Essential (primary) hypertension: Secondary | ICD-10-CM | POA: Diagnosis not present

## 2022-01-31 DIAGNOSIS — Z79899 Other long term (current) drug therapy: Secondary | ICD-10-CM | POA: Diagnosis not present

## 2022-01-31 DIAGNOSIS — M461 Sacroiliitis, not elsewhere classified: Secondary | ICD-10-CM | POA: Diagnosis not present

## 2022-01-31 DIAGNOSIS — Z6835 Body mass index (BMI) 35.0-35.9, adult: Secondary | ICD-10-CM | POA: Diagnosis not present

## 2022-01-31 DIAGNOSIS — M4306 Spondylolysis, lumbar region: Secondary | ICD-10-CM | POA: Diagnosis not present

## 2022-02-06 DIAGNOSIS — Z79899 Other long term (current) drug therapy: Secondary | ICD-10-CM | POA: Diagnosis not present

## 2022-02-08 DIAGNOSIS — Z0130 Encounter for examination of blood pressure without abnormal findings: Secondary | ICD-10-CM | POA: Diagnosis not present

## 2022-02-08 DIAGNOSIS — Z79899 Other long term (current) drug therapy: Secondary | ICD-10-CM | POA: Diagnosis not present

## 2022-02-08 DIAGNOSIS — E7800 Pure hypercholesterolemia, unspecified: Secondary | ICD-10-CM | POA: Diagnosis not present

## 2022-02-08 DIAGNOSIS — Z6835 Body mass index (BMI) 35.0-35.9, adult: Secondary | ICD-10-CM | POA: Diagnosis not present

## 2022-02-08 DIAGNOSIS — I10 Essential (primary) hypertension: Secondary | ICD-10-CM | POA: Diagnosis not present

## 2022-02-08 DIAGNOSIS — Z131 Encounter for screening for diabetes mellitus: Secondary | ICD-10-CM | POA: Diagnosis not present

## 2022-02-08 DIAGNOSIS — F1120 Opioid dependence, uncomplicated: Secondary | ICD-10-CM | POA: Diagnosis not present

## 2022-02-09 DIAGNOSIS — N3946 Mixed incontinence: Secondary | ICD-10-CM | POA: Diagnosis not present

## 2022-02-09 DIAGNOSIS — R350 Frequency of micturition: Secondary | ICD-10-CM | POA: Diagnosis not present

## 2022-02-21 DIAGNOSIS — H18513 Endothelial corneal dystrophy, bilateral: Secondary | ICD-10-CM | POA: Diagnosis not present

## 2022-03-09 DIAGNOSIS — G8929 Other chronic pain: Secondary | ICD-10-CM | POA: Diagnosis not present

## 2022-03-09 DIAGNOSIS — Z6835 Body mass index (BMI) 35.0-35.9, adult: Secondary | ICD-10-CM | POA: Diagnosis not present

## 2022-03-09 DIAGNOSIS — Z0130 Encounter for examination of blood pressure without abnormal findings: Secondary | ICD-10-CM | POA: Diagnosis not present

## 2022-03-09 DIAGNOSIS — N1831 Chronic kidney disease, stage 3a: Secondary | ICD-10-CM | POA: Diagnosis not present

## 2022-03-09 DIAGNOSIS — M549 Dorsalgia, unspecified: Secondary | ICD-10-CM | POA: Diagnosis not present

## 2022-03-09 DIAGNOSIS — M533 Sacrococcygeal disorders, not elsewhere classified: Secondary | ICD-10-CM | POA: Diagnosis not present

## 2022-03-09 DIAGNOSIS — M62830 Muscle spasm of back: Secondary | ICD-10-CM | POA: Diagnosis not present

## 2022-03-09 DIAGNOSIS — E7800 Pure hypercholesterolemia, unspecified: Secondary | ICD-10-CM | POA: Diagnosis not present

## 2022-03-09 DIAGNOSIS — M4306 Spondylolysis, lumbar region: Secondary | ICD-10-CM | POA: Diagnosis not present

## 2022-03-09 DIAGNOSIS — F1120 Opioid dependence, uncomplicated: Secondary | ICD-10-CM | POA: Diagnosis not present

## 2022-03-09 DIAGNOSIS — Z79899 Other long term (current) drug therapy: Secondary | ICD-10-CM | POA: Diagnosis not present

## 2022-03-09 DIAGNOSIS — M461 Sacroiliitis, not elsewhere classified: Secondary | ICD-10-CM | POA: Diagnosis not present

## 2022-03-09 DIAGNOSIS — I10 Essential (primary) hypertension: Secondary | ICD-10-CM | POA: Diagnosis not present

## 2022-03-13 DIAGNOSIS — Z79899 Other long term (current) drug therapy: Secondary | ICD-10-CM | POA: Diagnosis not present

## 2022-03-14 DIAGNOSIS — H18513 Endothelial corneal dystrophy, bilateral: Secondary | ICD-10-CM | POA: Diagnosis not present

## 2022-04-04 DIAGNOSIS — H18513 Endothelial corneal dystrophy, bilateral: Secondary | ICD-10-CM | POA: Diagnosis not present

## 2022-04-06 DIAGNOSIS — M461 Sacroiliitis, not elsewhere classified: Secondary | ICD-10-CM | POA: Diagnosis not present

## 2022-04-06 DIAGNOSIS — G8929 Other chronic pain: Secondary | ICD-10-CM | POA: Diagnosis not present

## 2022-04-06 DIAGNOSIS — M62830 Muscle spasm of back: Secondary | ICD-10-CM | POA: Diagnosis not present

## 2022-04-06 DIAGNOSIS — Z79899 Other long term (current) drug therapy: Secondary | ICD-10-CM | POA: Diagnosis not present

## 2022-04-06 DIAGNOSIS — M533 Sacrococcygeal disorders, not elsewhere classified: Secondary | ICD-10-CM | POA: Diagnosis not present

## 2022-04-06 DIAGNOSIS — N1831 Chronic kidney disease, stage 3a: Secondary | ICD-10-CM | POA: Diagnosis not present

## 2022-04-06 DIAGNOSIS — I10 Essential (primary) hypertension: Secondary | ICD-10-CM | POA: Diagnosis not present

## 2022-04-06 DIAGNOSIS — Z6835 Body mass index (BMI) 35.0-35.9, adult: Secondary | ICD-10-CM | POA: Diagnosis not present

## 2022-04-06 DIAGNOSIS — M4306 Spondylolysis, lumbar region: Secondary | ICD-10-CM | POA: Diagnosis not present

## 2022-04-06 DIAGNOSIS — Z0130 Encounter for examination of blood pressure without abnormal findings: Secondary | ICD-10-CM | POA: Diagnosis not present

## 2022-04-06 DIAGNOSIS — M549 Dorsalgia, unspecified: Secondary | ICD-10-CM | POA: Diagnosis not present

## 2022-04-06 DIAGNOSIS — E7800 Pure hypercholesterolemia, unspecified: Secondary | ICD-10-CM | POA: Diagnosis not present

## 2022-04-06 DIAGNOSIS — F1120 Opioid dependence, uncomplicated: Secondary | ICD-10-CM | POA: Diagnosis not present

## 2022-04-20 ENCOUNTER — Other Ambulatory Visit: Payer: Self-pay | Admitting: Interventional Cardiology

## 2022-05-02 DIAGNOSIS — H18513 Endothelial corneal dystrophy, bilateral: Secondary | ICD-10-CM | POA: Diagnosis not present

## 2022-05-03 DIAGNOSIS — N1831 Chronic kidney disease, stage 3a: Secondary | ICD-10-CM | POA: Diagnosis not present

## 2022-05-03 DIAGNOSIS — Z23 Encounter for immunization: Secondary | ICD-10-CM | POA: Diagnosis not present

## 2022-05-03 DIAGNOSIS — I25118 Atherosclerotic heart disease of native coronary artery with other forms of angina pectoris: Secondary | ICD-10-CM | POA: Diagnosis not present

## 2022-05-03 DIAGNOSIS — I10 Essential (primary) hypertension: Secondary | ICD-10-CM | POA: Diagnosis not present

## 2022-05-08 DIAGNOSIS — M461 Sacroiliitis, not elsewhere classified: Secondary | ICD-10-CM | POA: Diagnosis not present

## 2022-05-08 DIAGNOSIS — M533 Sacrococcygeal disorders, not elsewhere classified: Secondary | ICD-10-CM | POA: Diagnosis not present

## 2022-05-08 DIAGNOSIS — Z0130 Encounter for examination of blood pressure without abnormal findings: Secondary | ICD-10-CM | POA: Diagnosis not present

## 2022-05-08 DIAGNOSIS — N1831 Chronic kidney disease, stage 3a: Secondary | ICD-10-CM | POA: Diagnosis not present

## 2022-05-08 DIAGNOSIS — M4306 Spondylolysis, lumbar region: Secondary | ICD-10-CM | POA: Diagnosis not present

## 2022-05-08 DIAGNOSIS — E7800 Pure hypercholesterolemia, unspecified: Secondary | ICD-10-CM | POA: Diagnosis not present

## 2022-05-08 DIAGNOSIS — G8929 Other chronic pain: Secondary | ICD-10-CM | POA: Diagnosis not present

## 2022-05-08 DIAGNOSIS — Z6835 Body mass index (BMI) 35.0-35.9, adult: Secondary | ICD-10-CM | POA: Diagnosis not present

## 2022-05-08 DIAGNOSIS — I10 Essential (primary) hypertension: Secondary | ICD-10-CM | POA: Diagnosis not present

## 2022-05-08 DIAGNOSIS — M549 Dorsalgia, unspecified: Secondary | ICD-10-CM | POA: Diagnosis not present

## 2022-05-08 DIAGNOSIS — M62830 Muscle spasm of back: Secondary | ICD-10-CM | POA: Diagnosis not present

## 2022-05-08 DIAGNOSIS — F1120 Opioid dependence, uncomplicated: Secondary | ICD-10-CM | POA: Diagnosis not present

## 2022-05-21 ENCOUNTER — Other Ambulatory Visit: Payer: Self-pay | Admitting: Interventional Cardiology

## 2022-05-24 DIAGNOSIS — R7309 Other abnormal glucose: Secondary | ICD-10-CM | POA: Diagnosis not present

## 2022-05-24 DIAGNOSIS — N1831 Chronic kidney disease, stage 3a: Secondary | ICD-10-CM | POA: Diagnosis not present

## 2022-05-24 DIAGNOSIS — R7303 Prediabetes: Secondary | ICD-10-CM | POA: Diagnosis not present

## 2022-05-30 DIAGNOSIS — H18513 Endothelial corneal dystrophy, bilateral: Secondary | ICD-10-CM | POA: Diagnosis not present

## 2022-06-04 ENCOUNTER — Other Ambulatory Visit: Payer: Self-pay | Admitting: Interventional Cardiology

## 2022-06-06 DIAGNOSIS — M533 Sacrococcygeal disorders, not elsewhere classified: Secondary | ICD-10-CM | POA: Diagnosis not present

## 2022-06-06 DIAGNOSIS — M62830 Muscle spasm of back: Secondary | ICD-10-CM | POA: Diagnosis not present

## 2022-06-06 DIAGNOSIS — G8929 Other chronic pain: Secondary | ICD-10-CM | POA: Diagnosis not present

## 2022-06-06 DIAGNOSIS — E7800 Pure hypercholesterolemia, unspecified: Secondary | ICD-10-CM | POA: Diagnosis not present

## 2022-06-06 DIAGNOSIS — Z79899 Other long term (current) drug therapy: Secondary | ICD-10-CM | POA: Diagnosis not present

## 2022-06-06 DIAGNOSIS — M461 Sacroiliitis, not elsewhere classified: Secondary | ICD-10-CM | POA: Diagnosis not present

## 2022-06-06 DIAGNOSIS — M4306 Spondylolysis, lumbar region: Secondary | ICD-10-CM | POA: Diagnosis not present

## 2022-06-06 DIAGNOSIS — M549 Dorsalgia, unspecified: Secondary | ICD-10-CM | POA: Diagnosis not present

## 2022-06-06 DIAGNOSIS — N1831 Chronic kidney disease, stage 3a: Secondary | ICD-10-CM | POA: Diagnosis not present

## 2022-06-06 DIAGNOSIS — R7309 Other abnormal glucose: Secondary | ICD-10-CM | POA: Diagnosis not present

## 2022-06-06 DIAGNOSIS — Z6835 Body mass index (BMI) 35.0-35.9, adult: Secondary | ICD-10-CM | POA: Diagnosis not present

## 2022-06-06 DIAGNOSIS — Z0130 Encounter for examination of blood pressure without abnormal findings: Secondary | ICD-10-CM | POA: Diagnosis not present

## 2022-06-20 DIAGNOSIS — H18513 Endothelial corneal dystrophy, bilateral: Secondary | ICD-10-CM | POA: Diagnosis not present

## 2022-06-21 ENCOUNTER — Other Ambulatory Visit: Payer: Self-pay | Admitting: Interventional Cardiology

## 2022-06-29 ENCOUNTER — Ambulatory Visit (HOSPITAL_COMMUNITY)
Admission: RE | Admit: 2022-06-29 | Discharge: 2022-06-29 | Disposition: A | Discharge status: Home / Self Care | Payer: PPO | Source: Ambulatory Visit | Attending: Interventional Cardiology | Admitting: Interventional Cardiology

## 2022-06-29 DIAGNOSIS — I6523 Occlusion and stenosis of bilateral carotid arteries: Secondary | ICD-10-CM | POA: Insufficient documentation

## 2022-06-30 ENCOUNTER — Other Ambulatory Visit: Payer: Self-pay | Admitting: *Deleted

## 2022-06-30 DIAGNOSIS — I6523 Occlusion and stenosis of bilateral carotid arteries: Secondary | ICD-10-CM

## 2022-07-11 ENCOUNTER — Ambulatory Visit: Payer: PPO | Attending: Interventional Cardiology | Admitting: Interventional Cardiology

## 2022-07-11 ENCOUNTER — Encounter: Payer: Self-pay | Admitting: Interventional Cardiology

## 2022-07-11 VITALS — BP 102/42 | HR 66 | Ht 63.5 in | Wt 195.0 lb

## 2022-07-11 DIAGNOSIS — E782 Mixed hyperlipidemia: Secondary | ICD-10-CM

## 2022-07-11 DIAGNOSIS — I10 Essential (primary) hypertension: Secondary | ICD-10-CM

## 2022-07-11 DIAGNOSIS — E7800 Pure hypercholesterolemia, unspecified: Secondary | ICD-10-CM | POA: Diagnosis not present

## 2022-07-11 DIAGNOSIS — N1831 Chronic kidney disease, stage 3a: Secondary | ICD-10-CM | POA: Diagnosis not present

## 2022-07-11 DIAGNOSIS — M4306 Spondylolysis, lumbar region: Secondary | ICD-10-CM | POA: Diagnosis not present

## 2022-07-11 DIAGNOSIS — R030 Elevated blood-pressure reading, without diagnosis of hypertension: Secondary | ICD-10-CM | POA: Diagnosis not present

## 2022-07-11 DIAGNOSIS — I2510 Atherosclerotic heart disease of native coronary artery without angina pectoris: Secondary | ICD-10-CM

## 2022-07-11 DIAGNOSIS — F1120 Opioid dependence, uncomplicated: Secondary | ICD-10-CM | POA: Diagnosis not present

## 2022-07-11 DIAGNOSIS — M549 Dorsalgia, unspecified: Secondary | ICD-10-CM | POA: Diagnosis not present

## 2022-07-11 DIAGNOSIS — I6523 Occlusion and stenosis of bilateral carotid arteries: Secondary | ICD-10-CM | POA: Diagnosis not present

## 2022-07-11 DIAGNOSIS — M533 Sacrococcygeal disorders, not elsewhere classified: Secondary | ICD-10-CM | POA: Diagnosis not present

## 2022-07-11 DIAGNOSIS — Z79899 Other long term (current) drug therapy: Secondary | ICD-10-CM | POA: Diagnosis not present

## 2022-07-11 DIAGNOSIS — Z0130 Encounter for examination of blood pressure without abnormal findings: Secondary | ICD-10-CM | POA: Diagnosis not present

## 2022-07-11 DIAGNOSIS — Z6835 Body mass index (BMI) 35.0-35.9, adult: Secondary | ICD-10-CM | POA: Diagnosis not present

## 2022-07-11 DIAGNOSIS — R7309 Other abnormal glucose: Secondary | ICD-10-CM | POA: Diagnosis not present

## 2022-07-11 DIAGNOSIS — M461 Sacroiliitis, not elsewhere classified: Secondary | ICD-10-CM | POA: Diagnosis not present

## 2022-07-11 NOTE — Progress Notes (Signed)
Cardiology Office Note   Date:  07/11/2022   ID:  Lisa Petty, Lisa Petty 05-23-1936, MRN [id number]  PCP:  [name], MD    No chief complaint on file.  CAD  Wt Readings from Last 3 Encounters:  07/11/22 195 lb (88.5 kg)  05/20/21 205 lb 6.4 oz (93.2 kg)  01/31/21 208 lb 12.8 oz (94.7 kg)       History of Present Illness: Lisa Petty is a 86 y.o. female   who has an occluded carotid artery.  She also had an LAD stent in 2000.     Left leg edema is chronically worse than the right leg although she cannot remember any injury.   She gets her carotid scan annually.   She has chronic DOE.  Walking is limited by back pain.     "11/19:The left ventricular ejection fraction is hyperdynamic (>65%). Nuclear stress EF: 67%. There was no ST segment deviation noted during stress. The study is normal.   1. EF 67%, normal wall motion.  2. No significant perfusion defects (using upright stress images).  No evidence for ischemia or infarction.    Normal study. "   Bradycardia was an issue in 2021: "Bradycardia with heart rates in the 40s associated with dizziness and presyncope seen in the ED 03/04/2020.  Atenolol was decreased from 100 mg daily to 50 mg daily.  She is also on diltiazem 240 mg daily.  No longer symptomatic.  She is not checking her blood pressure pulse regularly at home.  Will place Holter monitor to see if we need to adjust her medications further."   Holter in 2021 showed: "Sinus rhythm with rare PACS, PVCs and short runs of PACs. Minimum HR 41 bpm at 2:36 PM. Average HR 60 bpm.   No clear indication of rhythm that necessitates pacemaker; but would have to take symptoms into consideration. "   Normal stress test in 03/2020.    Noted in 2022: "She did not tolerate 50 of hydralazine 3 times daily due to apparent reflex tachycardia. She also has slow heart rates at home, with her heart rate dropping into the 40s. Bystolic was added in May when she was in  the hospital for hypertensive urgency. " I saw her in October 2022 with the following plan: "Due to her age and some of the slow heart rates, I am concerned that she could develop symptoms. We will stop diltiazem. Start amlodipine 10 mg daily. Hopefully this will help control her blood pressure and her heart rate will no longer drop into the 40s. If she feels worse, she will go back to the diltiazem and hold the amlodipine. Continue ARB."  She saw the Pharm.D. hypertension clinic in 2022 as well after the above change and blood pressure had improved significantly.  Bradycardia had also resolved with changing diltiazem to amlodipine.  BP has been controlled since last year.    Walking limited by back and knee pain.  Denies : Chest pain. Dizziness. Leg edema. Nitroglycerin use. Orthopnea. Palpitations. Paroxysmal nocturnal dyspnea. Shortness of breath. Syncope.    No bleeding issues.  Lost weight, is eating less.  Feels full sooner.  Past Medical History:  Diagnosis Date   CKD (chronic kidney disease), stage III (HCC)    Coronary atherosclerosis of native coronary artery    s/p stent to LAD 2000-no MI, right ICA 100% stenosis;left side followed by Dr. Irish Lack   Essential hypertension, benign    Low back pain  Mixed hyperlipidemia    Occlusion and stenosis of carotid artery without mention of cerebral infarction    Osteopenia    mild to normal (BMD 08/18/09 normal)   Overactive bladder    on detrol    Vitamin D deficiency    repleted by ASCUS    Past Surgical History:  Procedure Laterality Date   AUGMENTATION MAMMAPLASTY Bilateral 1976     Current Outpatient Medications  Medication Sig Dispense Refill   amLODipine (NORVASC) 10 MG tablet Take 1 tablet by mouth once daily 90 tablet 0   atorvastatin (LIPITOR) 80 MG tablet Take 80 mg by mouth at bedtime.     baclofen (LIORESAL) 10 MG tablet Take 10 mg by mouth 3 (three) times daily as needed.     Calcium Carbonate-Vitamin D 600-400  MG-UNIT tablet Take 1 tablet by mouth daily.      clopidogrel (PLAVIX) 75 MG tablet Take 1 tablet by mouth once daily 90 tablet 3   ezetimibe (ZETIA) 10 MG tablet Take 1 tablet by mouth once daily 90 tablet 3   FARXIGA 10 MG TABS tablet Take 10 mg by mouth daily.     hydrALAZINE (APRESOLINE) 25 MG tablet Take 25 mg by mouth 3 (three) times daily.     hydrochlorothiazide (HYDRODIURIL) 25 MG tablet Take 1 tablet by mouth daily.     isosorbide mononitrate (IMDUR) 30 MG 24 hr tablet Take 1 tablet (30 mg total) by mouth daily. 30 tablet 5   losartan (COZAAR) 100 MG tablet Take 100 mg by mouth daily.     mirabegron ER (MYRBETRIQ) 25 MG TB24 tablet Take 25 mg by mouth daily.     nebivolol (BYSTOLIC) 2.5 MG tablet Take 2.5 mg by mouth daily.     nitroGLYCERIN (NITROSTAT) 0.4 MG SL tablet Place 1 tablet (0.4 mg total) under the tongue every 5 (five) minutes as needed for chest pain. 25 tablet 1   sodium chloride (MURO 128) 5 % ophthalmic ointment Place 1 application into both eyes at bedtime.     Sodium Chloride, Hypertonic, (MURO 128 OP) Apply 4 drops to eye 4 (four) times daily.     tolterodine (DETROL LA) 4 MG 24 hr capsule Take 4 mg by mouth daily.     No current facility-administered medications for this visit.    Allergies:   Lisinopril    Social History:  The patient  reports that she has never smoked. She has never used smokeless tobacco. She reports that she does not drink alcohol.   Family History:  The patient's family history includes CAD in her brother, father, and son; Dementia in her mother; Diabetes in her sister; Heart attack in her brother, father, and son; Lung cancer in her father.    ROS:  Please see the history of present illness.   Otherwise, review of systems are positive for knee pain, back pain.   All other systems are reviewed and negative.    PHYSICAL EXAM: VS:  BP (!) 102/42   Pulse 66   Ht 5' 3.5" (1.613 m)   Wt 195 lb (88.5 kg)   SpO2 95%   BMI 34.00 kg/m  ,  BMI Body mass index is 34 kg/m. GEN: Well nourished, well developed, in no acute distress HEENT: normal Neck: no JVD, carotid bruits, or masses Cardiac: RRR; no murmurs, rubs, or gallops,no edema  Respiratory:  clear to auscultation bilaterally, normal work of breathing GI: soft, nontender, nondistended, + BS MS: no deformity or atrophy; 2+ DP  pulses Skin: warm and dry, no rash Neuro:  Strength and sensation are intact Psych: euthymic mood, full affect   EKG:   The ekg ordered today demonstrates NSR, no ST changes   Recent Labs: No results found for requested labs within last 365 days.   Lipid Panel    Component Value Date/Time   CHOL 142 01/02/2021 0259   CHOL 153 06/19/2018 1128   TRIG 100 01/02/2021 0259   HDL 43 01/02/2021 0259   HDL 50 06/19/2018 1128   CHOLHDL 3.3 01/02/2021 0259   VLDL 20 01/02/2021 0259   LDLCALC 79 01/02/2021 0259   LDLCALC 80 06/19/2018 1128     Other studies Reviewed: Additional studies/ records that were reviewed today with results demonstrating: May 2023 LDL 84 HDL 49 triglycerides 143 cholesterol 158.  A1c 6.2 in October 2023.   ASSESSMENT AND PLAN:  CAD: No angina on medical therapy.  Tolerating clopidogrel monotherapy.  No bleeding problems.  Increasing activity to 150 minutes/week will be helpful.  She could try a stationary bike for water aerobics given her back and knee pain.  She does have Silver sneakers so should be able to get a free membership at the [facility]. Carotid artery disease: Stable Duplex in 11/23.  Repeat in 11/24.  Hyperlipidemia: Continue atorvastatin.  Lipids adequately controlled.  We spoke about high-fiber diet.  Avoid processed foods. HTN: The current medical regimen is effective;  continue present plan and medications.  Bradycardia that was noted last year has resolved.   Current medicines are reviewed at length with the patient today.  The patient concerns regarding her medicines were addressed.  The following  changes have been made:  No change  Labs/ tests ordered today include:  No orders of the defined types were placed in this encounter.   Recommend 150 minutes/week of aerobic exercise Low fat, low carb, high fiber diet recommended  Disposition:   FU in 1 year   Signed, Larae Grooms, MD  07/11/2022 2:50 PM    [facility] [address], [place], [place]  [address] Phone: [phone]; Fax: [phone]

## 2022-07-11 NOTE — Patient Instructions (Signed)
Medication Instructions:  Your physician recommends that you continue on your current medications as directed. Please refer to the Current Medication list given to you today.  *If you need a refill on your cardiac medications before your next appointment, please call your pharmacy*   Lab Work: None  If you have labs (blood work) drawn today and your tests are completely normal, you will receive your results only by: Sharon (if you have MyChart) OR A paper copy in the mail If you have any lab test that is abnormal or we need to change your treatment, we will call you to review the results.   Testing/Procedures: None  Follow-Up: At [facility], you and your health needs are our priority.  As part of our continuing mission to provide you with exceptional heart care, we have created designated Provider Care Teams.  These Care Teams include your primary Cardiologist (physician) and Advanced Practice Providers (APPs -  Physician Assistants and Nurse Practitioners) who all work together to provide you with the care you need, when you need it.  We recommend signing up for the patient portal called "MyChart".  Sign up information is provided on this After Visit Summary.  MyChart is used to connect with patients for Virtual Visits (Telemedicine).  Patients are able to view lab/test results, encounter notes, upcoming appointments, etc.  Non-urgent messages can be sent to your provider as well.   To learn more about what you can do with MyChart, go to [email or web address].    Your next appointment:   12 month(s)  The format for your next appointment:   In Person  Provider:   Larae Grooms, MD    Important Information About Sugar

## 2022-07-18 DIAGNOSIS — H18513 Endothelial corneal dystrophy, bilateral: Secondary | ICD-10-CM | POA: Diagnosis not present

## 2022-08-08 DIAGNOSIS — H18513 Endothelial corneal dystrophy, bilateral: Secondary | ICD-10-CM | POA: Diagnosis not present

## 2022-08-15 DIAGNOSIS — Z0130 Encounter for examination of blood pressure without abnormal findings: Secondary | ICD-10-CM | POA: Diagnosis not present

## 2022-08-15 DIAGNOSIS — G8929 Other chronic pain: Secondary | ICD-10-CM | POA: Diagnosis not present

## 2022-08-15 DIAGNOSIS — M549 Dorsalgia, unspecified: Secondary | ICD-10-CM | POA: Diagnosis not present

## 2022-08-15 DIAGNOSIS — Z6835 Body mass index (BMI) 35.0-35.9, adult: Secondary | ICD-10-CM | POA: Diagnosis not present

## 2022-08-15 DIAGNOSIS — I10 Essential (primary) hypertension: Secondary | ICD-10-CM | POA: Diagnosis not present

## 2022-08-15 DIAGNOSIS — M62830 Muscle spasm of back: Secondary | ICD-10-CM | POA: Diagnosis not present

## 2022-08-15 DIAGNOSIS — M4306 Spondylolysis, lumbar region: Secondary | ICD-10-CM | POA: Diagnosis not present

## 2022-08-15 DIAGNOSIS — N1831 Chronic kidney disease, stage 3a: Secondary | ICD-10-CM | POA: Diagnosis not present

## 2022-08-15 DIAGNOSIS — F1120 Opioid dependence, uncomplicated: Secondary | ICD-10-CM | POA: Diagnosis not present

## 2022-08-21 DIAGNOSIS — Z79899 Other long term (current) drug therapy: Secondary | ICD-10-CM | POA: Diagnosis not present

## 2022-08-22 DIAGNOSIS — N1831 Chronic kidney disease, stage 3a: Secondary | ICD-10-CM | POA: Diagnosis not present

## 2022-08-30 DIAGNOSIS — N1832 Chronic kidney disease, stage 3b: Secondary | ICD-10-CM | POA: Diagnosis not present

## 2022-08-30 DIAGNOSIS — I10 Essential (primary) hypertension: Secondary | ICD-10-CM | POA: Diagnosis not present

## 2022-08-30 DIAGNOSIS — M47816 Spondylosis without myelopathy or radiculopathy, lumbar region: Secondary | ICD-10-CM | POA: Diagnosis not present

## 2022-08-30 DIAGNOSIS — I25118 Atherosclerotic heart disease of native coronary artery with other forms of angina pectoris: Secondary | ICD-10-CM | POA: Diagnosis not present

## 2022-08-30 DIAGNOSIS — G8929 Other chronic pain: Secondary | ICD-10-CM | POA: Diagnosis not present

## 2022-08-31 DIAGNOSIS — R7303 Prediabetes: Secondary | ICD-10-CM | POA: Diagnosis not present

## 2022-08-31 DIAGNOSIS — E559 Vitamin D deficiency, unspecified: Secondary | ICD-10-CM | POA: Diagnosis not present

## 2022-08-31 DIAGNOSIS — I129 Hypertensive chronic kidney disease with stage 1 through stage 4 chronic kidney disease, or unspecified chronic kidney disease: Secondary | ICD-10-CM | POA: Diagnosis not present

## 2022-08-31 DIAGNOSIS — N1831 Chronic kidney disease, stage 3a: Secondary | ICD-10-CM | POA: Diagnosis not present

## 2022-09-06 ENCOUNTER — Other Ambulatory Visit: Payer: Self-pay | Admitting: Interventional Cardiology

## 2022-09-12 ENCOUNTER — Other Ambulatory Visit: Payer: Self-pay | Admitting: Internal Medicine

## 2022-09-12 DIAGNOSIS — Z1231 Encounter for screening mammogram for malignant neoplasm of breast: Secondary | ICD-10-CM

## 2022-09-18 DIAGNOSIS — M549 Dorsalgia, unspecified: Secondary | ICD-10-CM | POA: Diagnosis not present

## 2022-09-18 DIAGNOSIS — M129 Arthropathy, unspecified: Secondary | ICD-10-CM | POA: Diagnosis not present

## 2022-09-18 DIAGNOSIS — Z6835 Body mass index (BMI) 35.0-35.9, adult: Secondary | ICD-10-CM | POA: Diagnosis not present

## 2022-09-18 DIAGNOSIS — M62830 Muscle spasm of back: Secondary | ICD-10-CM | POA: Diagnosis not present

## 2022-09-18 DIAGNOSIS — F1120 Opioid dependence, uncomplicated: Secondary | ICD-10-CM | POA: Diagnosis not present

## 2022-09-18 DIAGNOSIS — I10 Essential (primary) hypertension: Secondary | ICD-10-CM | POA: Diagnosis not present

## 2022-09-18 DIAGNOSIS — Z0130 Encounter for examination of blood pressure without abnormal findings: Secondary | ICD-10-CM | POA: Diagnosis not present

## 2022-09-18 DIAGNOSIS — M4306 Spondylolysis, lumbar region: Secondary | ICD-10-CM | POA: Diagnosis not present

## 2022-09-18 DIAGNOSIS — E559 Vitamin D deficiency, unspecified: Secondary | ICD-10-CM | POA: Diagnosis not present

## 2022-09-19 DIAGNOSIS — H43393 Other vitreous opacities, bilateral: Secondary | ICD-10-CM | POA: Diagnosis not present

## 2022-09-19 DIAGNOSIS — H40033 Anatomical narrow angle, bilateral: Secondary | ICD-10-CM | POA: Diagnosis not present

## 2022-10-05 ENCOUNTER — Ambulatory Visit: Payer: PPO | Admitting: Interventional Cardiology

## 2022-10-17 DIAGNOSIS — H18513 Endothelial corneal dystrophy, bilateral: Secondary | ICD-10-CM | POA: Diagnosis not present

## 2022-10-18 DIAGNOSIS — M4306 Spondylolysis, lumbar region: Secondary | ICD-10-CM | POA: Diagnosis not present

## 2022-10-18 DIAGNOSIS — Z6835 Body mass index (BMI) 35.0-35.9, adult: Secondary | ICD-10-CM | POA: Diagnosis not present

## 2022-10-18 DIAGNOSIS — I10 Essential (primary) hypertension: Secondary | ICD-10-CM | POA: Diagnosis not present

## 2022-10-18 DIAGNOSIS — Z0130 Encounter for examination of blood pressure without abnormal findings: Secondary | ICD-10-CM | POA: Diagnosis not present

## 2022-10-18 DIAGNOSIS — M62830 Muscle spasm of back: Secondary | ICD-10-CM | POA: Diagnosis not present

## 2022-10-18 DIAGNOSIS — M549 Dorsalgia, unspecified: Secondary | ICD-10-CM | POA: Diagnosis not present

## 2022-10-18 DIAGNOSIS — E559 Vitamin D deficiency, unspecified: Secondary | ICD-10-CM | POA: Diagnosis not present

## 2022-10-18 DIAGNOSIS — F1120 Opioid dependence, uncomplicated: Secondary | ICD-10-CM | POA: Diagnosis not present

## 2022-11-02 ENCOUNTER — Ambulatory Visit
Admission: RE | Admit: 2022-11-02 | Discharge: 2022-11-02 | Disposition: A | Discharge status: Home / Self Care | Payer: PPO | Source: Ambulatory Visit | Attending: Internal Medicine | Admitting: Internal Medicine

## 2022-11-02 DIAGNOSIS — Z1231 Encounter for screening mammogram for malignant neoplasm of breast: Secondary | ICD-10-CM | POA: Diagnosis not present

## 2022-12-26 DIAGNOSIS — H18513 Endothelial corneal dystrophy, bilateral: Secondary | ICD-10-CM | POA: Diagnosis not present

## 2023-01-03 DIAGNOSIS — G8929 Other chronic pain: Secondary | ICD-10-CM | POA: Diagnosis not present

## 2023-01-03 DIAGNOSIS — Z6835 Body mass index (BMI) 35.0-35.9, adult: Secondary | ICD-10-CM | POA: Diagnosis not present

## 2023-01-03 DIAGNOSIS — F1120 Opioid dependence, uncomplicated: Secondary | ICD-10-CM | POA: Diagnosis not present

## 2023-01-03 DIAGNOSIS — M62830 Muscle spasm of back: Secondary | ICD-10-CM | POA: Diagnosis not present

## 2023-01-03 DIAGNOSIS — I10 Essential (primary) hypertension: Secondary | ICD-10-CM | POA: Diagnosis not present

## 2023-01-03 DIAGNOSIS — M4306 Spondylolysis, lumbar region: Secondary | ICD-10-CM | POA: Diagnosis not present

## 2023-01-03 DIAGNOSIS — M549 Dorsalgia, unspecified: Secondary | ICD-10-CM | POA: Diagnosis not present

## 2023-01-03 DIAGNOSIS — Z0130 Encounter for examination of blood pressure without abnormal findings: Secondary | ICD-10-CM | POA: Diagnosis not present

## 2023-01-10 DIAGNOSIS — Z79899 Other long term (current) drug therapy: Secondary | ICD-10-CM | POA: Diagnosis not present

## 2023-01-18 IMAGING — DX DG CHEST 2V
2 series · 2 of 2 positions shown · non-contrast
Comparison: 07/26/2020

CLINICAL DATA: Chest pain

EXAM:
CHEST - 2 VIEW

[chest pa]
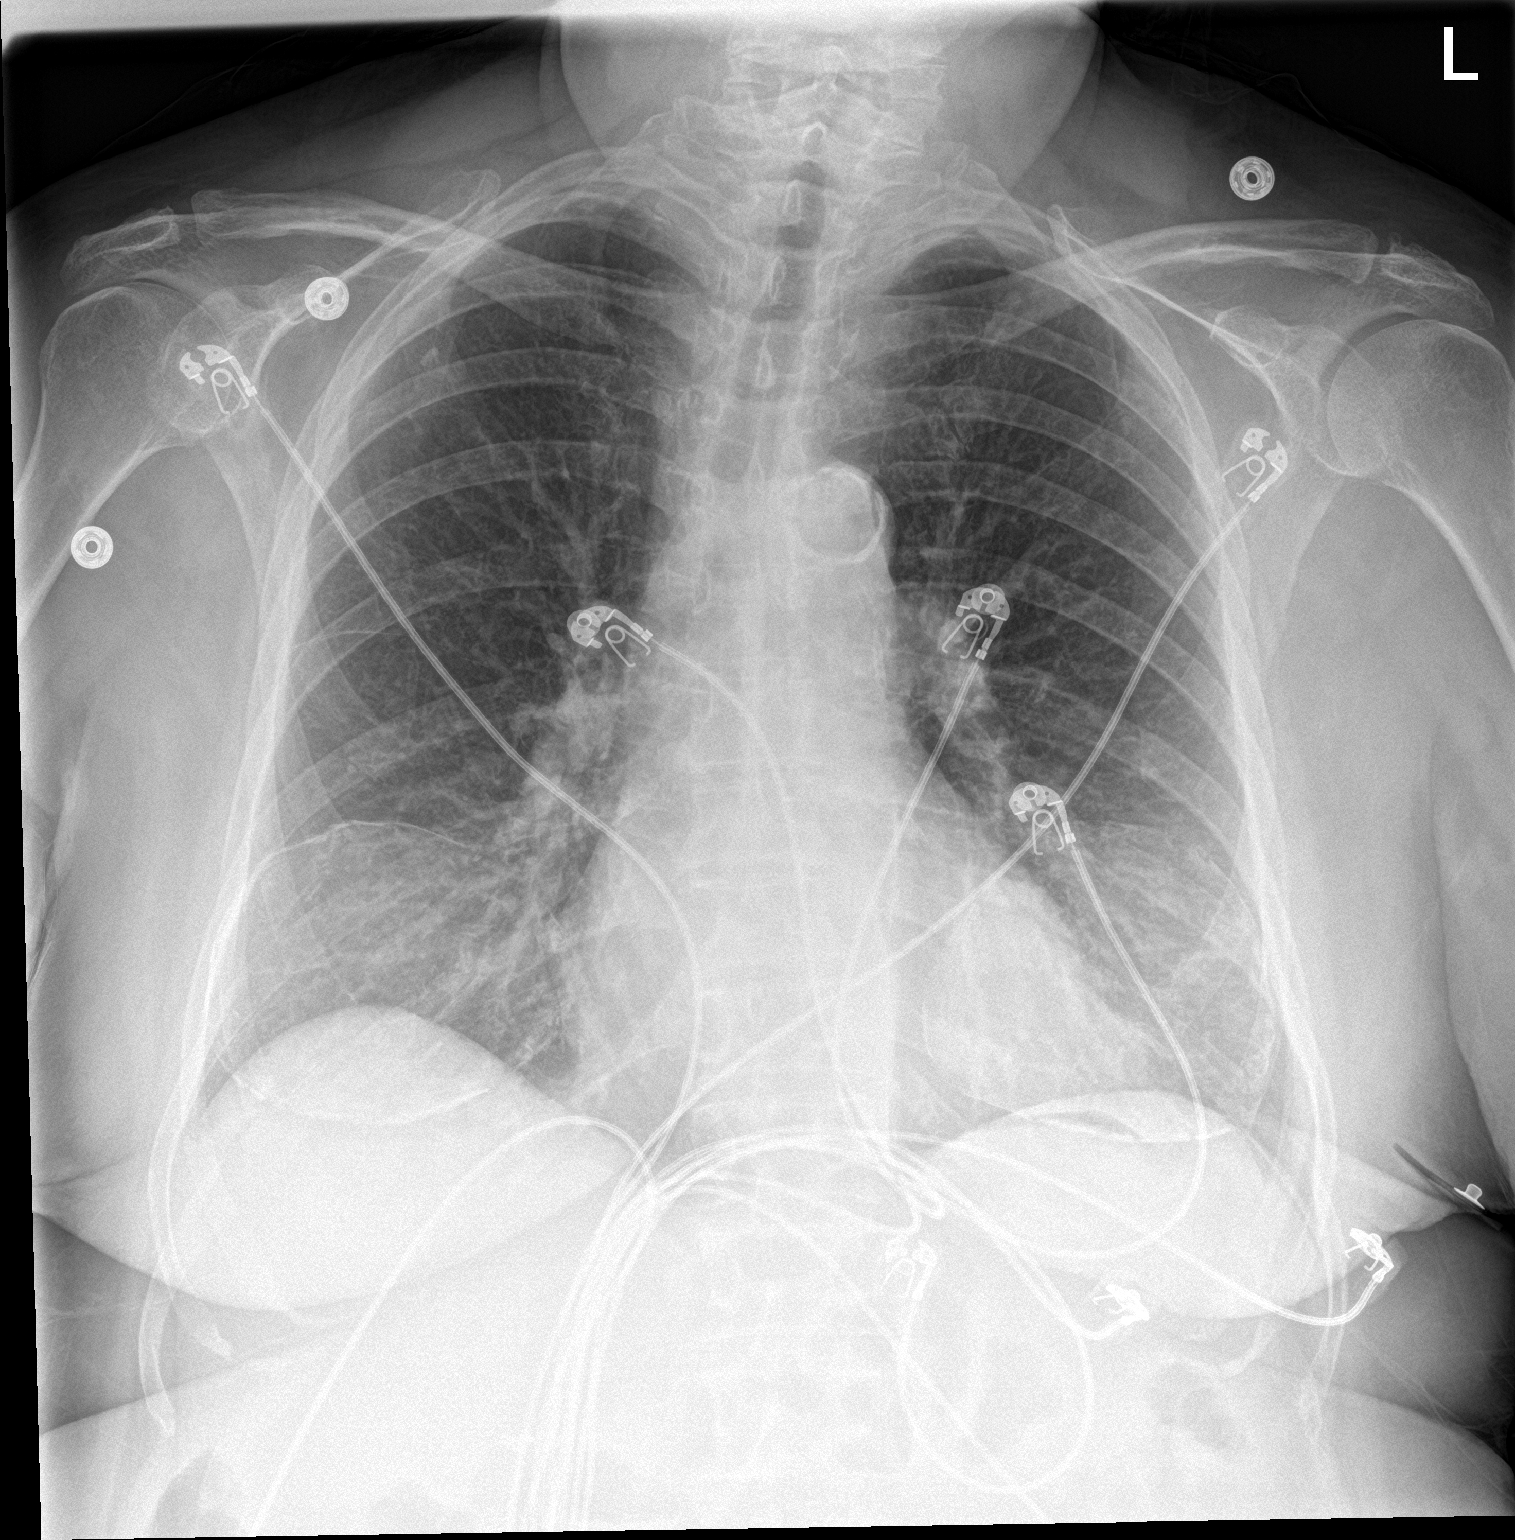

[chest lat]
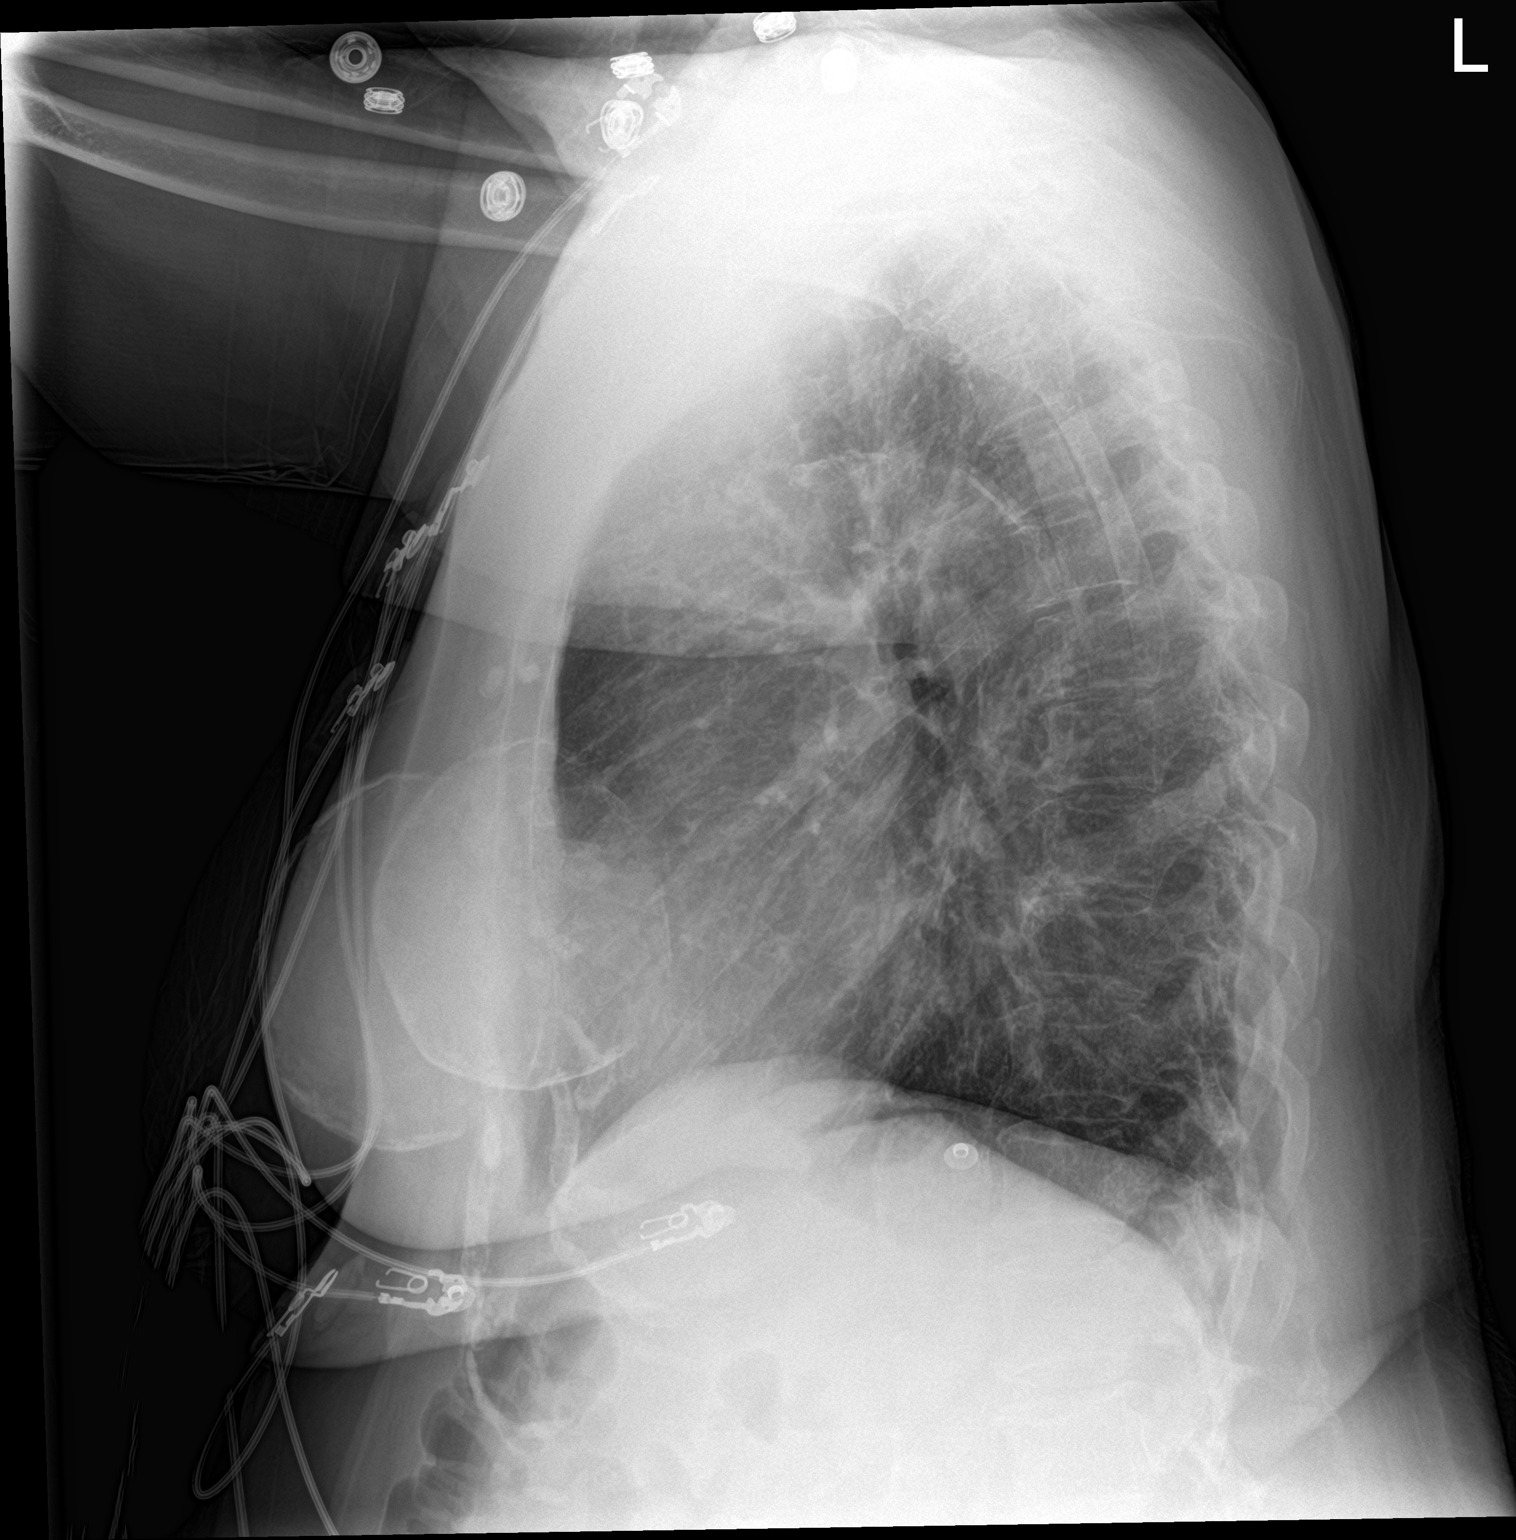

[2 of 2 positions shown; findings below may reference images not displayed]

FINDINGS: No focal opacity or pleural effusion. Chronic bronchitic changes.
Stable cardiomediastinal silhouette with aortic atherosclerosis. No
pneumothorax. Calcified breast implants. Calcified nodule in the
right upper lobe.
IMPRESSION: No active cardiopulmonary disease.

## 2023-01-30 DIAGNOSIS — H18513 Endothelial corneal dystrophy, bilateral: Secondary | ICD-10-CM | POA: Diagnosis not present

## 2023-01-31 DIAGNOSIS — Z6833 Body mass index (BMI) 33.0-33.9, adult: Secondary | ICD-10-CM | POA: Diagnosis not present

## 2023-01-31 DIAGNOSIS — I10 Essential (primary) hypertension: Secondary | ICD-10-CM | POA: Diagnosis not present

## 2023-01-31 DIAGNOSIS — M4306 Spondylolysis, lumbar region: Secondary | ICD-10-CM | POA: Diagnosis not present

## 2023-01-31 DIAGNOSIS — M549 Dorsalgia, unspecified: Secondary | ICD-10-CM | POA: Diagnosis not present

## 2023-01-31 DIAGNOSIS — F1120 Opioid dependence, uncomplicated: Secondary | ICD-10-CM | POA: Diagnosis not present

## 2023-01-31 DIAGNOSIS — M62830 Muscle spasm of back: Secondary | ICD-10-CM | POA: Diagnosis not present

## 2023-01-31 DIAGNOSIS — G8929 Other chronic pain: Secondary | ICD-10-CM | POA: Diagnosis not present

## 2023-02-01 DIAGNOSIS — R350 Frequency of micturition: Secondary | ICD-10-CM | POA: Diagnosis not present

## 2023-02-01 DIAGNOSIS — N3946 Mixed incontinence: Secondary | ICD-10-CM | POA: Diagnosis not present

## 2023-02-02 DIAGNOSIS — R7303 Prediabetes: Secondary | ICD-10-CM | POA: Diagnosis not present

## 2023-02-02 DIAGNOSIS — M170 Bilateral primary osteoarthritis of knee: Secondary | ICD-10-CM | POA: Diagnosis not present

## 2023-02-02 DIAGNOSIS — I25118 Atherosclerotic heart disease of native coronary artery with other forms of angina pectoris: Secondary | ICD-10-CM | POA: Diagnosis not present

## 2023-02-02 DIAGNOSIS — G894 Chronic pain syndrome: Secondary | ICD-10-CM | POA: Diagnosis not present

## 2023-02-02 DIAGNOSIS — E559 Vitamin D deficiency, unspecified: Secondary | ICD-10-CM | POA: Diagnosis not present

## 2023-02-02 DIAGNOSIS — M47816 Spondylosis without myelopathy or radiculopathy, lumbar region: Secondary | ICD-10-CM | POA: Diagnosis not present

## 2023-02-02 DIAGNOSIS — N1831 Chronic kidney disease, stage 3a: Secondary | ICD-10-CM | POA: Diagnosis not present

## 2023-02-02 DIAGNOSIS — I10 Essential (primary) hypertension: Secondary | ICD-10-CM | POA: Diagnosis not present

## 2023-02-02 DIAGNOSIS — R058 Other specified cough: Secondary | ICD-10-CM | POA: Diagnosis not present

## 2023-02-02 DIAGNOSIS — Z0000 Encounter for general adult medical examination without abnormal findings: Secondary | ICD-10-CM | POA: Diagnosis not present

## 2023-02-02 DIAGNOSIS — Z1331 Encounter for screening for depression: Secondary | ICD-10-CM | POA: Diagnosis not present

## 2023-02-27 DIAGNOSIS — H18513 Endothelial corneal dystrophy, bilateral: Secondary | ICD-10-CM | POA: Diagnosis not present

## 2023-02-28 DIAGNOSIS — F1120 Opioid dependence, uncomplicated: Secondary | ICD-10-CM | POA: Diagnosis not present

## 2023-02-28 DIAGNOSIS — M4306 Spondylolysis, lumbar region: Secondary | ICD-10-CM | POA: Diagnosis not present

## 2023-02-28 DIAGNOSIS — I10 Essential (primary) hypertension: Secondary | ICD-10-CM | POA: Diagnosis not present

## 2023-02-28 DIAGNOSIS — M549 Dorsalgia, unspecified: Secondary | ICD-10-CM | POA: Diagnosis not present

## 2023-02-28 DIAGNOSIS — Z6833 Body mass index (BMI) 33.0-33.9, adult: Secondary | ICD-10-CM | POA: Diagnosis not present

## 2023-02-28 DIAGNOSIS — E6609 Other obesity due to excess calories: Secondary | ICD-10-CM | POA: Diagnosis not present

## 2023-02-28 DIAGNOSIS — G8929 Other chronic pain: Secondary | ICD-10-CM | POA: Diagnosis not present

## 2023-02-28 DIAGNOSIS — M62830 Muscle spasm of back: Secondary | ICD-10-CM | POA: Diagnosis not present

## 2023-03-27 DIAGNOSIS — H18513 Endothelial corneal dystrophy, bilateral: Secondary | ICD-10-CM | POA: Diagnosis not present

## 2023-04-02 DIAGNOSIS — M4306 Spondylolysis, lumbar region: Secondary | ICD-10-CM | POA: Diagnosis not present

## 2023-04-02 DIAGNOSIS — M62830 Muscle spasm of back: Secondary | ICD-10-CM | POA: Diagnosis not present

## 2023-04-02 DIAGNOSIS — Z79899 Other long term (current) drug therapy: Secondary | ICD-10-CM | POA: Diagnosis not present

## 2023-04-02 DIAGNOSIS — F1120 Opioid dependence, uncomplicated: Secondary | ICD-10-CM | POA: Diagnosis not present

## 2023-04-02 DIAGNOSIS — E6609 Other obesity due to excess calories: Secondary | ICD-10-CM | POA: Diagnosis not present

## 2023-04-24 DIAGNOSIS — H18513 Endothelial corneal dystrophy, bilateral: Secondary | ICD-10-CM | POA: Diagnosis not present

## 2023-05-03 DIAGNOSIS — M62830 Muscle spasm of back: Secondary | ICD-10-CM | POA: Diagnosis not present

## 2023-05-03 DIAGNOSIS — Z79899 Other long term (current) drug therapy: Secondary | ICD-10-CM | POA: Diagnosis not present

## 2023-05-03 DIAGNOSIS — F1120 Opioid dependence, uncomplicated: Secondary | ICD-10-CM | POA: Diagnosis not present

## 2023-05-03 DIAGNOSIS — M4306 Spondylolysis, lumbar region: Secondary | ICD-10-CM | POA: Diagnosis not present

## 2023-05-03 DIAGNOSIS — Z6833 Body mass index (BMI) 33.0-33.9, adult: Secondary | ICD-10-CM | POA: Diagnosis not present

## 2023-05-03 DIAGNOSIS — E6609 Other obesity due to excess calories: Secondary | ICD-10-CM | POA: Diagnosis not present

## 2023-05-07 DIAGNOSIS — H18513 Endothelial corneal dystrophy, bilateral: Secondary | ICD-10-CM | POA: Diagnosis not present

## 2023-05-15 DIAGNOSIS — H1013 Acute atopic conjunctivitis, bilateral: Secondary | ICD-10-CM | POA: Diagnosis not present

## 2023-05-24 DIAGNOSIS — I10 Essential (primary) hypertension: Secondary | ICD-10-CM | POA: Diagnosis not present

## 2023-05-24 DIAGNOSIS — D692 Other nonthrombocytopenic purpura: Secondary | ICD-10-CM | POA: Diagnosis not present

## 2023-05-24 DIAGNOSIS — M479 Spondylosis, unspecified: Secondary | ICD-10-CM | POA: Diagnosis not present

## 2023-05-24 DIAGNOSIS — E785 Hyperlipidemia, unspecified: Secondary | ICD-10-CM | POA: Diagnosis not present

## 2023-06-07 DIAGNOSIS — Z0000 Encounter for general adult medical examination without abnormal findings: Secondary | ICD-10-CM | POA: Diagnosis not present

## 2023-06-07 DIAGNOSIS — M4306 Spondylolysis, lumbar region: Secondary | ICD-10-CM | POA: Diagnosis not present

## 2023-06-07 DIAGNOSIS — M62830 Muscle spasm of back: Secondary | ICD-10-CM | POA: Diagnosis not present

## 2023-06-07 DIAGNOSIS — I10 Essential (primary) hypertension: Secondary | ICD-10-CM | POA: Diagnosis not present

## 2023-06-07 DIAGNOSIS — Z6833 Body mass index (BMI) 33.0-33.9, adult: Secondary | ICD-10-CM | POA: Diagnosis not present

## 2023-06-07 DIAGNOSIS — F1120 Opioid dependence, uncomplicated: Secondary | ICD-10-CM | POA: Diagnosis not present

## 2023-06-07 DIAGNOSIS — E6609 Other obesity due to excess calories: Secondary | ICD-10-CM | POA: Diagnosis not present

## 2023-06-29 ENCOUNTER — Other Ambulatory Visit (HOSPITAL_COMMUNITY): Payer: Self-pay | Admitting: Cardiology

## 2023-06-29 DIAGNOSIS — I6523 Occlusion and stenosis of bilateral carotid arteries: Secondary | ICD-10-CM

## 2023-07-02 ENCOUNTER — Ambulatory Visit (HOSPITAL_COMMUNITY)
Admission: RE | Admit: 2023-07-02 | Discharge: 2023-07-02 | Disposition: A | Discharge status: Home / Self Care | Payer: PPO | Source: Ambulatory Visit | Attending: Cardiology | Admitting: Cardiology

## 2023-07-02 DIAGNOSIS — I6523 Occlusion and stenosis of bilateral carotid arteries: Secondary | ICD-10-CM | POA: Diagnosis not present

## 2023-07-02 NOTE — Progress Notes (Signed)
Carotid artery duplex 07/02/2023: Right Carotid: Evidence consistent with a total occlusion of the right ICA. The ECA appears >50% stenosed. Left Carotid: Velocities in the left ICA are consistent with a 1-39% stenosis.The ECA appears >50% stenosed. Vertebrals:  Left vertebral artery demonstrates antegrade flow. Right vertebral artery demonstrates an occlusion. Subclavians: Normal flow hemodynamics were seen in bilateral subclavian  arteries. No significant change from  06/29/2022. Suggest follow up study in 12 months.  I will discuss more on OV with me soon

## 2023-07-03 DIAGNOSIS — I129 Hypertensive chronic kidney disease with stage 1 through stage 4 chronic kidney disease, or unspecified chronic kidney disease: Secondary | ICD-10-CM | POA: Diagnosis not present

## 2023-07-03 DIAGNOSIS — F1120 Opioid dependence, uncomplicated: Secondary | ICD-10-CM | POA: Diagnosis not present

## 2023-07-03 DIAGNOSIS — M479 Spondylosis, unspecified: Secondary | ICD-10-CM | POA: Diagnosis not present

## 2023-07-03 DIAGNOSIS — N1832 Chronic kidney disease, stage 3b: Secondary | ICD-10-CM | POA: Diagnosis not present

## 2023-07-03 DIAGNOSIS — M4306 Spondylolysis, lumbar region: Secondary | ICD-10-CM | POA: Diagnosis not present

## 2023-07-03 DIAGNOSIS — E6609 Other obesity due to excess calories: Secondary | ICD-10-CM | POA: Diagnosis not present

## 2023-07-03 DIAGNOSIS — Z6833 Body mass index (BMI) 33.0-33.9, adult: Secondary | ICD-10-CM | POA: Diagnosis not present

## 2023-07-03 DIAGNOSIS — M62830 Muscle spasm of back: Secondary | ICD-10-CM | POA: Diagnosis not present

## 2023-07-03 DIAGNOSIS — D692 Other nonthrombocytopenic purpura: Secondary | ICD-10-CM | POA: Diagnosis not present

## 2023-07-11 ENCOUNTER — Other Ambulatory Visit: Payer: Self-pay | Admitting: Cardiology

## 2023-07-11 ENCOUNTER — Other Ambulatory Visit: Payer: Self-pay | Admitting: *Deleted

## 2023-07-11 ENCOUNTER — Ambulatory Visit: Payer: PPO | Attending: Cardiology | Admitting: Cardiology

## 2023-07-11 ENCOUNTER — Encounter: Payer: Self-pay | Admitting: Cardiology

## 2023-07-11 VITALS — BP 120/64 | HR 78 | Ht 64.0 in | Wt 195.8 lb

## 2023-07-11 DIAGNOSIS — E782 Mixed hyperlipidemia: Secondary | ICD-10-CM

## 2023-07-11 DIAGNOSIS — I2510 Atherosclerotic heart disease of native coronary artery without angina pectoris: Secondary | ICD-10-CM | POA: Diagnosis not present

## 2023-07-11 DIAGNOSIS — I10 Essential (primary) hypertension: Secondary | ICD-10-CM

## 2023-07-11 DIAGNOSIS — I6523 Occlusion and stenosis of bilateral carotid arteries: Secondary | ICD-10-CM

## 2023-07-11 MED ORDER — NEXLIZET 180-10 MG PO TABS: 1.0000 | ORAL_TABLET | Freq: Every day | ORAL | 2 refills | Status: DC

## 2023-07-11 NOTE — Patient Instructions (Signed)
Medication Instructions:  Your physician has recommended you make the following change in your medication:  Stop Ezetimibe Start Nexlizet 180-10 mg by mouth once daily   *If you need a refill on your cardiac medications before your next appointment, please call your pharmacy*   Lab Work: Your physician recommends that you return for lab work in: about 2 months.  Lipid and liver profiles.  This will be fasting.  You can go to [facility] on the first floor or any [facility] location  If you have labs (blood work) drawn today and your tests are completely normal, you will receive your results only by: MyChart Message (if you have MyChart) OR A paper copy in the mail If you have any lab test that is abnormal or we need to change your treatment, we will call you to review the results.   Testing/Procedures: Your physician has requested that you have a carotid duplex.To be done in one year This test is an ultrasound of the carotid arteries in your neck. It looks at blood flow through these arteries that supply the brain with blood. Allow one hour for this exam. There are no restrictions or special instructions.    Follow-Up: At [facility], you and your health needs are our priority.  As part of our continuing mission to provide you with exceptional heart care, we have created designated Provider Care Teams.  These Care Teams include your primary Cardiologist (physician) and Advanced Practice Providers (APPs -  Physician Assistants and Nurse Practitioners) who all work together to provide you with the care you need, when you need it.  We recommend signing up for the patient portal called "MyChart".  Sign up information is provided on this After Visit Summary.  MyChart is used to connect with patients for Virtual Visits (Telemedicine).  Patients are able to view lab/test results, encounter notes, upcoming appointments, etc.  Non-urgent messages can be sent to your provider as well.   To learn  more about what you can do with MyChart, go to [email or web address].    Your next appointment:   12 month(s)  Provider:   Yates Decamp, MD     Other Instructions

## 2023-07-11 NOTE — Progress Notes (Unsigned)
Cardiology Office Note:  .   Date:  07/12/2023  ID:  Lisa Petty, DOB 08/08/1936, MRN [id number] PCP: Lisa Ishihara, MD  [facility] Providers Cardiologist:  Lisa Decamp, MD   History of Present Illness: Lisa Petty   Lisa Petty is a 87 y.o. Caucasian female patient with coronary disease SP LAD stent in 2000, asymptomatic bradycardia, primary hypertension, mixed hypercholesterolemia, asymptomatic chronic right carotid artery occlusion presents here for annual visit.  She has had a low risk nuclear stress test on 03/16/2020.  Discussed the use of AI scribe software for clinical note transcription with the patient, who gave verbal consent to proceed.  History of Present Illness   The patient, with a history of high cholesterol, is currently on Lipitor and Zetia. Despite being on these medications, her cholesterol levels remain high. She reports that she mostly eats out, often choosing vegetable plates. She does cook at home occasionally but prefers to eat out to avoid the chores of cooking and cleaning. She admits to consuming fried fish and chicken but rarely eats beef or pork. She is not physically active and does not engage in regular exercise. She denies any recent changes in her medication regimen. She also denies any side effects from her current medications. She is not a diabetic and takes [place] for kidney protection. Her blood pressure is monitored at home, but she does not recall the readings.      Review of Systems  Cardiovascular:  Negative for chest pain, dyspnea on exertion and leg swelling.   Labs   External Labs:  Labs 02/08/2023:  Vitamin D33.9.  Total cholesterol 156, triglycerides 140, HDL 45, LDL 87.  Serum glucose 108 mg, BUN 22, creatinine 1.23, EGFR 43 mL, potassium 4.4, LFTs normal.  Hb 12.2/HCT 37.6, platelet 343, normal indicis.  Physical Exam:   VS:  BP 120/64   Pulse 78   Ht 5\' 4"  (1.626 m)   Wt 195 lb 12.8 oz (88.8 kg)   SpO2 95%   BMI  33.61 kg/m    Wt Readings from Last 3 Encounters:  07/11/23 195 lb 12.8 oz (88.8 kg)  07/11/22 195 lb (88.5 kg)  05/20/21 205 lb 6.4 oz (93.2 kg)     Physical Exam Neck:     Vascular: Carotid bruit (soft bilateral) present. No JVD.  Cardiovascular:     Rate and Rhythm: Normal rate and regular rhythm.     Pulses: Intact distal pulses.     Heart sounds: S1 normal and S2 normal. Murmur heard.     Early systolic murmur is present with a grade of 2/6 at the upper right sternal border.     No gallop.  Pulmonary:     Effort: Pulmonary effort is normal.     Breath sounds: Normal breath sounds.  Abdominal:     General: Bowel sounds are normal.     Palpations: Abdomen is soft.  Musculoskeletal:     Right lower leg: No edema.     Left lower leg: No edema.     Studies Reviewed: .    Carotid artery duplex 07/02/2023: Right Carotid: Evidence consistent with a total occlusion of the right ICA. The ECA appears >50% stenosed. Left Carotid: Velocities in the left ICA are consistent with a 1-39% stenosis.The ECA appears >50% stenosed. Vertebrals:  Left vertebral artery demonstrates antegrade flow. Right vertebral artery demonstrates an occlusion. Subclavians: Normal flow hemodynamics were seen in bilateral subclavian  arteries. No significant change from  06/29/2022. Suggest follow  up study in 12 months.  EKG:    EKG Interpretation Date/Time:  Wednesday July 11 2023 09:57:53 EST Ventricular Rate:  78 PR Interval:  196 QRS Duration:  82 QT Interval:  400 QTC Calculation: 456 R Axis:   1  Text Interpretation: EKG 07/11/2023: Normal sinus rhythm at rate of 78 bpm, incomplete right bundle branch block.  Otherwise normal EKG.  No significant change from 12/31/2020. Confirmed by Delrae Rend [phone]) on 07/11/2023 10:07:12 AM    Medications and allergies    Allergies  Allergen Reactions   Lisinopril Cough    Current Outpatient Medications:    amLODipine (NORVASC) 10 MG tablet,  Take 1 tablet by mouth once daily, Disp: 90 tablet, Rfl: 0   atorvastatin (LIPITOR) 80 MG tablet, Take 80 mg by mouth at bedtime., Disp: , Rfl:    baclofen (LIORESAL) 10 MG tablet, Take 10 mg by mouth 3 (three) times daily as needed., Disp: , Rfl:    Calcium Carbonate-Vitamin D 600-400 MG-UNIT tablet, Take 1 tablet by mouth daily. , Disp: , Rfl:    clopidogrel (PLAVIX) 75 MG tablet, Take 1 tablet by mouth once daily, Disp: 90 tablet, Rfl: 3   FARXIGA 10 MG TABS tablet, Take 10 mg by mouth daily., Disp: , Rfl:    hydrALAZINE (APRESOLINE) 25 MG tablet, Take 25 mg by mouth 3 (three) times daily., Disp: , Rfl:    hydrochlorothiazide (HYDRODIURIL) 25 MG tablet, Take 1 tablet by mouth daily., Disp: , Rfl:    isosorbide mononitrate (IMDUR) 30 MG 24 hr tablet, Take 1 tablet (30 mg total) by mouth daily., Disp: 30 tablet, Rfl: 5   losartan (COZAAR) 100 MG tablet, Take 100 mg by mouth daily., Disp: , Rfl:    mirabegron ER (MYRBETRIQ) 25 MG TB24 tablet, Take 25 mg by mouth daily., Disp: , Rfl:    nebivolol (BYSTOLIC) 2.5 MG tablet, Take 2.5 mg by mouth daily., Disp: , Rfl:    nitroGLYCERIN (NITROSTAT) 0.4 MG SL tablet, Place 1 tablet (0.4 mg total) under the tongue every 5 (five) minutes as needed for chest pain., Disp: 25 tablet, Rfl: 1   sodium chloride (MURO 128) 5 % ophthalmic ointment, Place 1 application into both eyes at bedtime., Disp: , Rfl:    Sodium Chloride, Hypertonic, (MURO 128 OP), Apply 4 drops to eye 4 (four) times daily., Disp: , Rfl:    tolterodine (DETROL LA) 4 MG 24 hr capsule, Take 4 mg by mouth daily., Disp: , Rfl:    Bempedoic Acid-Ezetimibe (NEXLIZET) 180-10 MG TABS, TAKE 1 TABLET BY MOUTH ONCE DAILY, Disp: 90 tablet, Rfl: 1   ASSESSMENT AND PLAN: .      ICD-10-CM   1. Coronary artery disease involving native coronary artery of native heart without angina pectoris  I25.10 EKG 12-Lead    DISCONTINUED: Bempedoic Acid-Ezetimibe (NEXLIZET) 180-10 MG TABS    2. Bilateral carotid  artery stenosis  I65.23 EKG 12-Lead    VAS US CAROTID    DISCONTINUED: Bempedoic Acid-Ezetimibe (NEXLIZET) 180-10 MG TABS    3. Essential hypertension, benign  I10 EKG 12-Lead    4. Mixed hyperlipidemia  E78.2 EKG 12-Lead    Lipid Profile    Hepatic function panel    DISCONTINUED: Bempedoic Acid-Ezetimibe (NEXLIZET) 180-10 MG TABS     Assessment and Plan    Hyperlipidemia Despite being on Lipitor 80mg  and Zetia, cholesterol levels remain high. Discussed the importance of diet and physical activity. Patient is mostly vegetarian and does not consume much  fried food or beef. Discussed the possibility of adding another medication to lower cholesterol levels. -Discontinue Zetia. -Start Nexlizet 180-10 mg, pending cost-effectiveness. -Encourage patient to lose about 5 pounds and reduce fat intake. -Check lipid panel in 2 months to assess the effect of medication change.  Carotid Artery Disease Right carotid artery is completely occluded, but stable. Left carotid artery is patent. Discussed the importance of controlling cholesterol levels to prevent further progression of disease. -Continue current management. -Recheck carotid arteries in 1 year.  Blood pressure is well-managed and well-controlled on amlodipine, hydrochlorothiazide and hydralazine along with losartan and Bystolic.  Coronary disease also stable and on appropriate medical therapy, patient is presently on Plavix long-term.  She has not had any recurrence of angina pectoris.  No changes in the EKG.  General Health Maintenance Encouraged patient to increase physical activity by cooking and cleaning at home. -Return for follow-up visit in 1 year.       Signed,  Lisa Decamp, MD, [facility] 07/12/2023, 6:32 AM [facility] [address] [place], [place] [address] Phone: [phone]. Fax:  [phone]

## 2023-07-13 ENCOUNTER — Other Ambulatory Visit (HOSPITAL_COMMUNITY): Payer: Self-pay

## 2023-07-13 ENCOUNTER — Telehealth: Payer: Self-pay | Admitting: Pharmacy Technician

## 2023-07-13 NOTE — Telephone Encounter (Signed)
Pharmacy Patient Advocate Encounter  Received notification from [facility] ADVANTAGE/RX ADVANCE that Prior Authorization for nexlizet has been APPROVED from 07/13/23 to 01/09/24. Ran test claim, Copay is $200.00 3 months. This test claim was processed through [facility]- copay amounts may vary at other pharmacies due to pharmacy/plan contracts, or as the patient moves through the different stages of their insurance plan.   PA #/Case ID/Reference #: [id number]

## 2023-07-13 NOTE — Telephone Encounter (Signed)
Pharmacy Patient Advocate Encounter   Received notification from CoverMyMeds that prior authorization for nexlizet is required/requested.   Insurance verification completed.   The patient is insured through [facility] ADVANTAGE/RX ADVANCE .   Per test claim: PA required; PA submitted to above mentioned insurance via CoverMyMeds Key/confirmation #/EOC [id number] Status is pending

## 2023-07-25 ENCOUNTER — Other Ambulatory Visit: Payer: Self-pay | Admitting: Interventional Cardiology

## 2023-07-31 DIAGNOSIS — I25119 Atherosclerotic heart disease of native coronary artery with unspecified angina pectoris: Secondary | ICD-10-CM | POA: Diagnosis not present

## 2023-07-31 DIAGNOSIS — I779 Disorder of arteries and arterioles, unspecified: Secondary | ICD-10-CM | POA: Diagnosis not present

## 2023-07-31 DIAGNOSIS — I129 Hypertensive chronic kidney disease with stage 1 through stage 4 chronic kidney disease, or unspecified chronic kidney disease: Secondary | ICD-10-CM | POA: Diagnosis not present

## 2023-07-31 DIAGNOSIS — I119 Hypertensive heart disease without heart failure: Secondary | ICD-10-CM | POA: Diagnosis not present

## 2023-07-31 DIAGNOSIS — N1832 Chronic kidney disease, stage 3b: Secondary | ICD-10-CM | POA: Diagnosis not present

## 2023-08-02 ENCOUNTER — Other Ambulatory Visit: Payer: Self-pay | Admitting: Interventional Cardiology

## 2023-08-06 ENCOUNTER — Other Ambulatory Visit: Payer: Self-pay | Admitting: Interventional Cardiology

## 2023-08-13 DIAGNOSIS — E6609 Other obesity due to excess calories: Secondary | ICD-10-CM | POA: Diagnosis not present

## 2023-08-13 DIAGNOSIS — M62830 Muscle spasm of back: Secondary | ICD-10-CM | POA: Diagnosis not present

## 2023-08-13 DIAGNOSIS — M4306 Spondylolysis, lumbar region: Secondary | ICD-10-CM | POA: Diagnosis not present

## 2023-08-13 DIAGNOSIS — Z6833 Body mass index (BMI) 33.0-33.9, adult: Secondary | ICD-10-CM | POA: Diagnosis not present

## 2023-08-13 DIAGNOSIS — F1120 Opioid dependence, uncomplicated: Secondary | ICD-10-CM | POA: Diagnosis not present

## 2023-09-04 DIAGNOSIS — N1831 Chronic kidney disease, stage 3a: Secondary | ICD-10-CM | POA: Diagnosis not present

## 2023-09-05 LAB — LAB REPORT - SCANNED
Albumin, Urine POC: 27.7
Albumin/Creatinine Ratio, Urine, POC: 32
Creatinine, POC: 87 mg/dL
PTH: 44

## 2023-09-13 DIAGNOSIS — Z0000 Encounter for general adult medical examination without abnormal findings: Secondary | ICD-10-CM | POA: Diagnosis not present

## 2023-09-28 ENCOUNTER — Other Ambulatory Visit: Payer: Self-pay | Admitting: Interventional Cardiology

## 2023-10-29 IMAGING — MG DIGITAL SCREENING BREAST BILAT IMPLANT W/ TOMO W/ CAD
9 of 12 series · 9 of 28 positions shown · non-contrast
Comparison: Previous exam(s).

CLINICAL DATA: Screening.

EXAM:
DIGITAL SCREENING BILATERAL MAMMOGRAM WITH IMPLANTS, CAD AND
TOMOSYNTHESIS
TECHNIQUE: Bilateral screening digital craniocaudal and mediolateral oblique
mammograms were obtained. Bilateral screening digital breast
tomosynthesis was performed. The images were evaluated with
computer-aided detection. Standard and/or implant displaced views
were performed.

[R MLO]
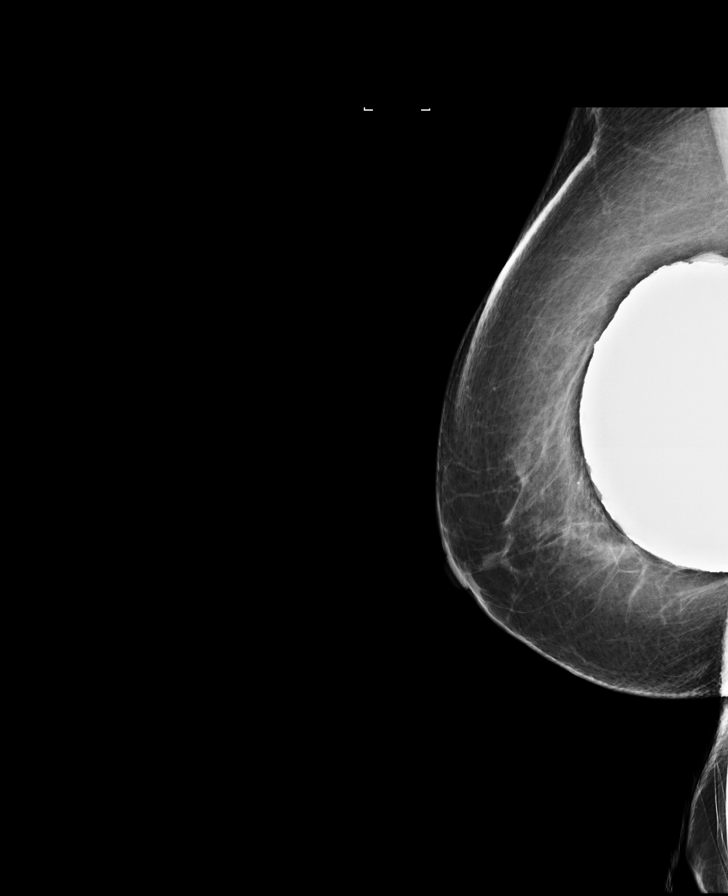

[L CC]
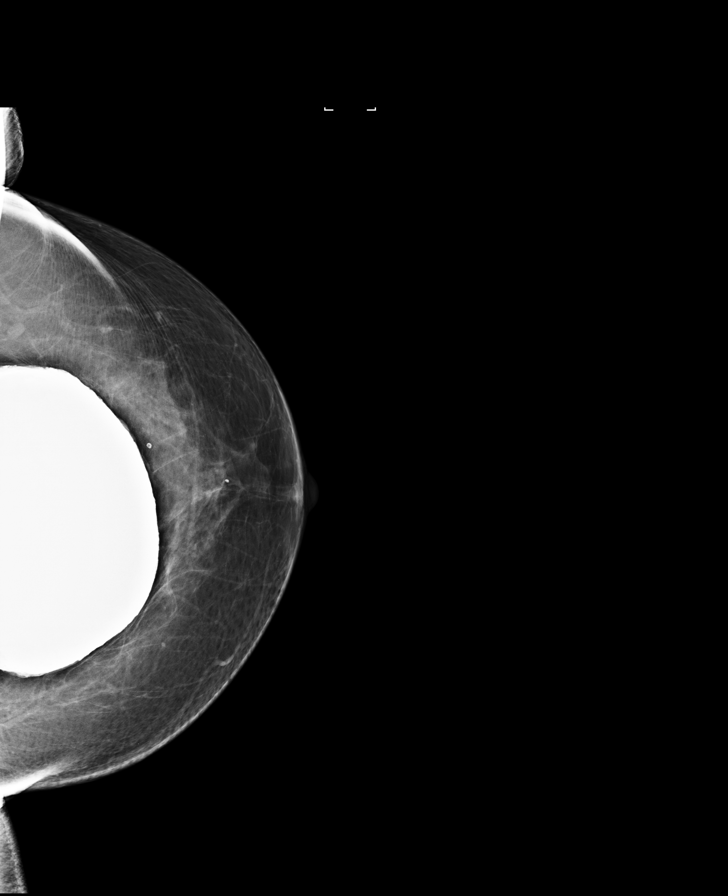

[L MLO]
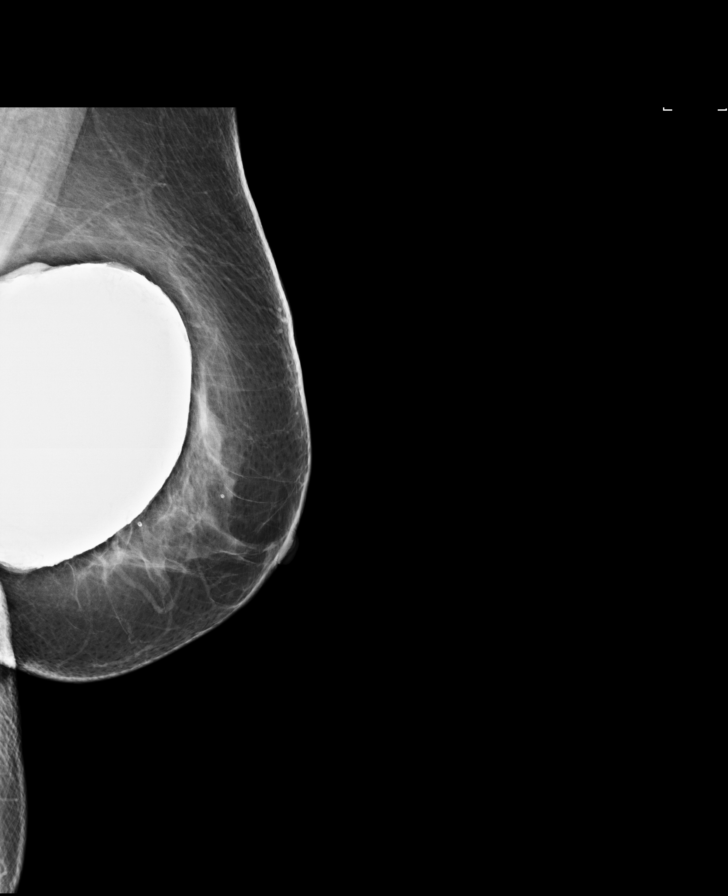

[R CC]
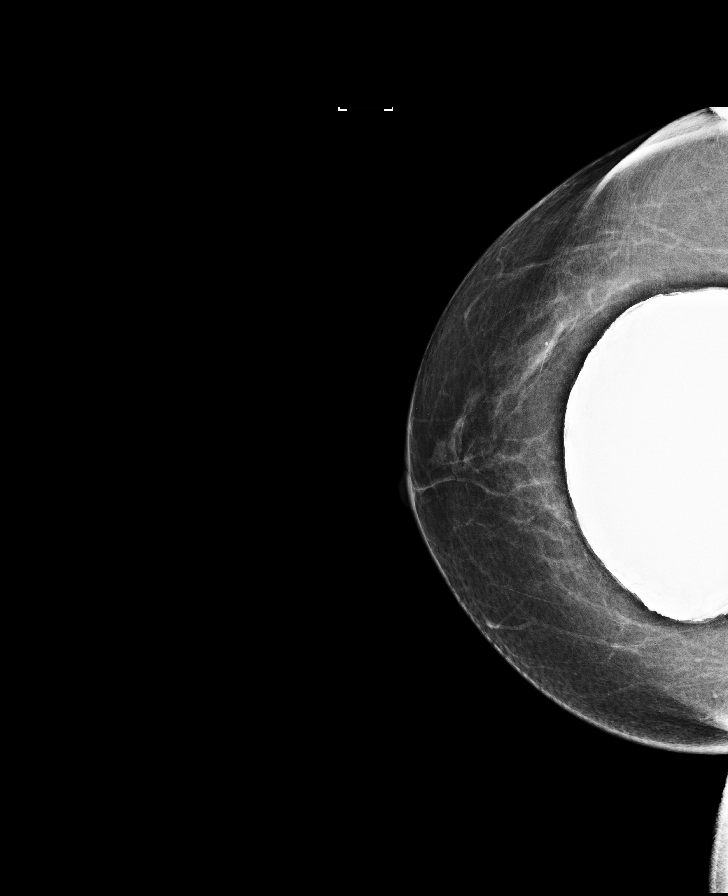

[L MLO synth-2D]
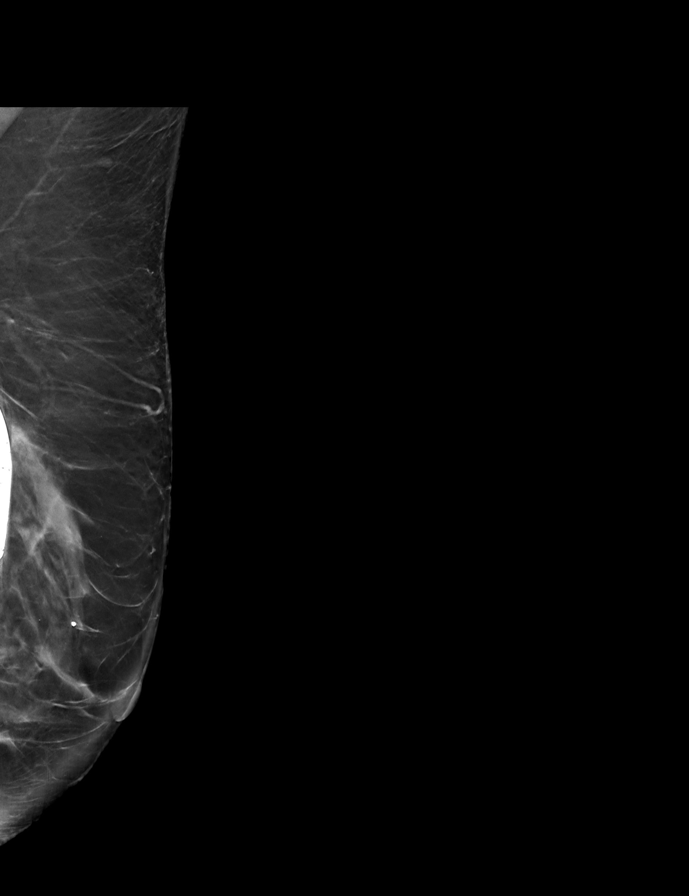

[L CC synth-2D]
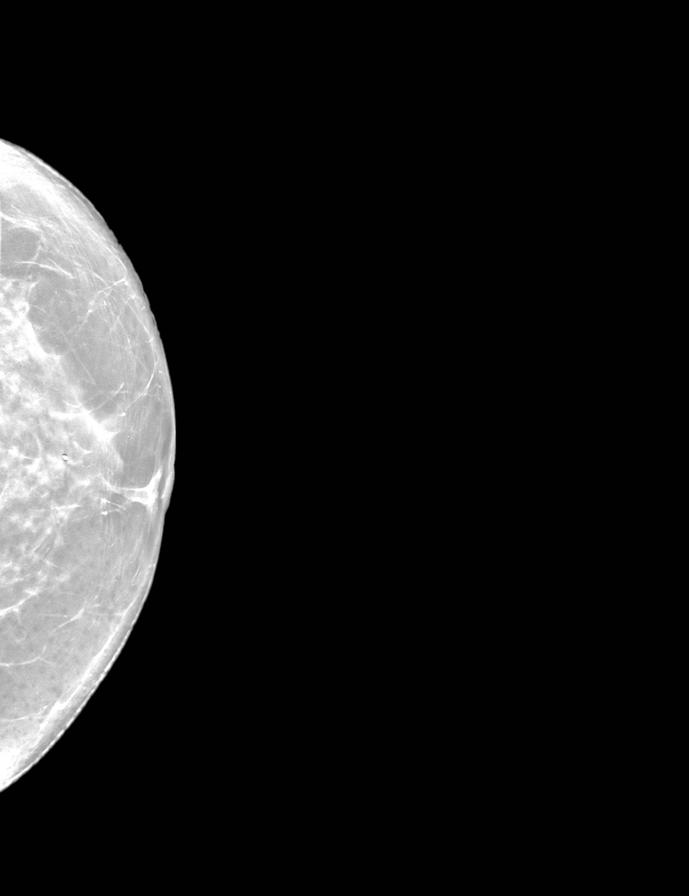

[R MLO synth-2D]
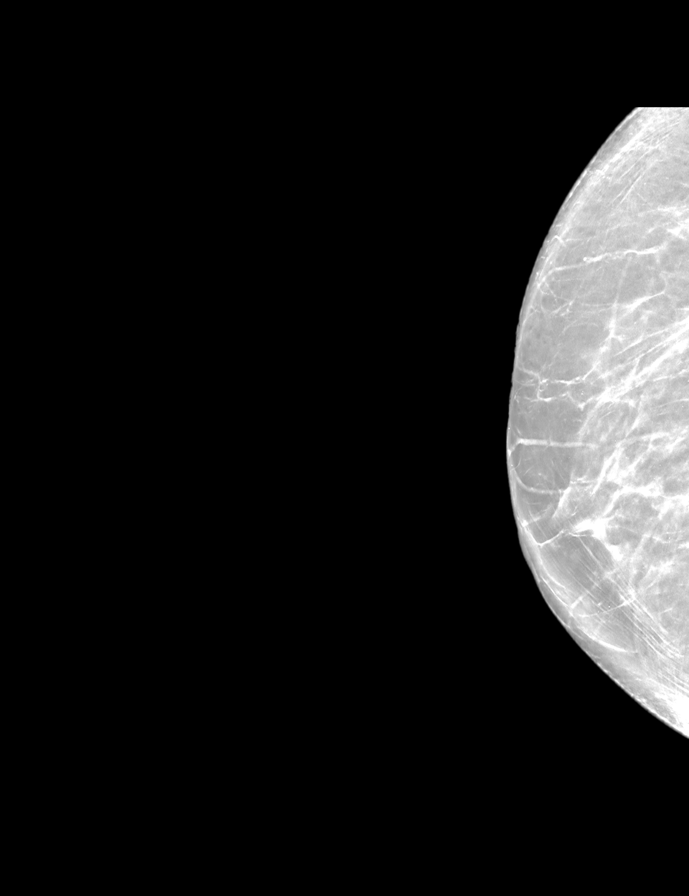

[R CC synth-2D]
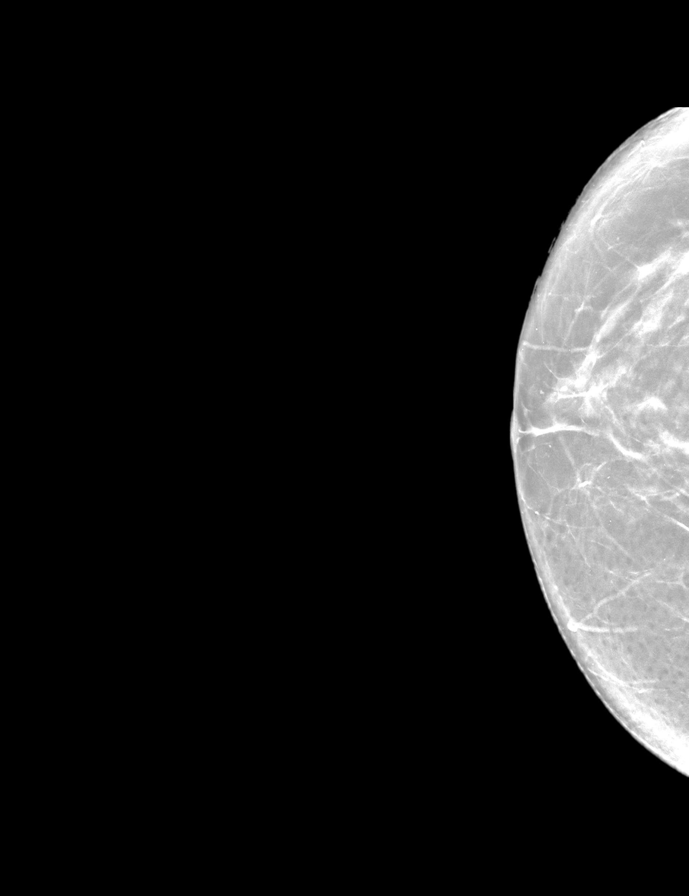

[L MLOID BREAST TOMOSYNTHESIS IMAGE tomo · tomo slice 40/79.0]
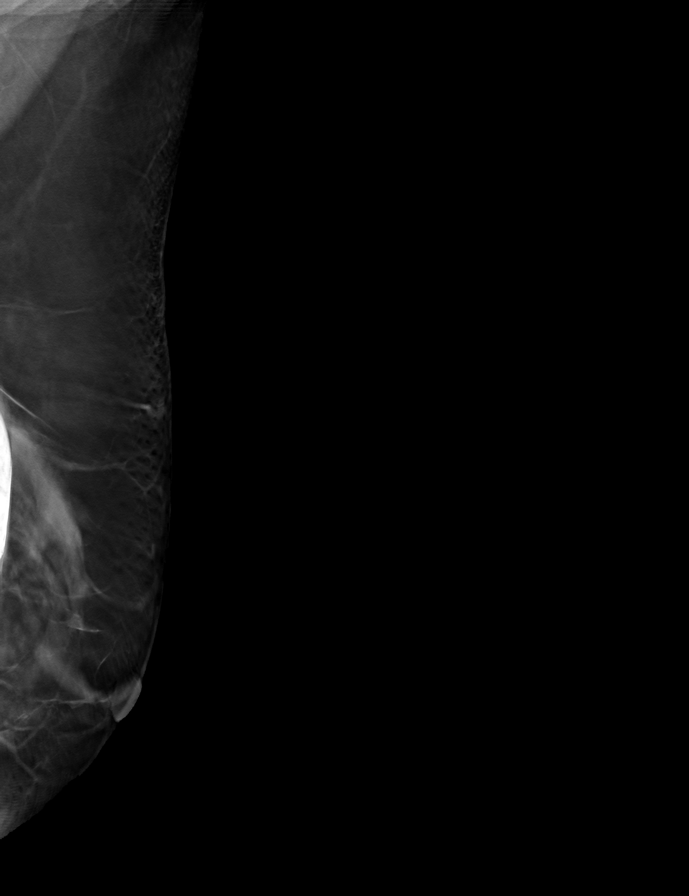

[9 of 28 positions shown; findings below may reference images not displayed]

ACR Breast Density Category b: There are scattered areas of
fibroglandular density.
FINDINGS: The patient has retroglandular implants. There are no findings
suspicious for malignancy.
IMPRESSION: No mammographic evidence of malignancy. A result letter of this
screening mammogram will be mailed directly to the patient.

RECOMMENDATION:
Screening mammogram in one year. (Code:ST-V-2PT)

BI-RADS CATEGORY  1:  Negative.

## 2023-11-07 DIAGNOSIS — N1831 Chronic kidney disease, stage 3a: Secondary | ICD-10-CM | POA: Diagnosis not present

## 2023-11-07 DIAGNOSIS — E6609 Other obesity due to excess calories: Secondary | ICD-10-CM | POA: Diagnosis not present

## 2023-11-07 DIAGNOSIS — M4306 Spondylolysis, lumbar region: Secondary | ICD-10-CM | POA: Diagnosis not present

## 2023-11-07 DIAGNOSIS — Z79899 Other long term (current) drug therapy: Secondary | ICD-10-CM | POA: Diagnosis not present

## 2023-11-07 DIAGNOSIS — M62830 Muscle spasm of back: Secondary | ICD-10-CM | POA: Diagnosis not present

## 2023-11-07 DIAGNOSIS — R5383 Other fatigue: Secondary | ICD-10-CM | POA: Diagnosis not present

## 2023-11-07 DIAGNOSIS — Z6833 Body mass index (BMI) 33.0-33.9, adult: Secondary | ICD-10-CM | POA: Diagnosis not present

## 2023-12-06 DIAGNOSIS — I10 Essential (primary) hypertension: Secondary | ICD-10-CM | POA: Diagnosis not present

## 2023-12-06 DIAGNOSIS — R9431 Abnormal electrocardiogram [ECG] [EKG]: Secondary | ICD-10-CM | POA: Diagnosis not present

## 2023-12-06 DIAGNOSIS — Z79899 Other long term (current) drug therapy: Secondary | ICD-10-CM | POA: Diagnosis not present

## 2023-12-06 DIAGNOSIS — M62830 Muscle spasm of back: Secondary | ICD-10-CM | POA: Diagnosis not present

## 2023-12-06 DIAGNOSIS — Z6833 Body mass index (BMI) 33.0-33.9, adult: Secondary | ICD-10-CM | POA: Diagnosis not present

## 2023-12-06 DIAGNOSIS — M4306 Spondylolysis, lumbar region: Secondary | ICD-10-CM | POA: Diagnosis not present

## 2024-01-10 DIAGNOSIS — N1831 Chronic kidney disease, stage 3a: Secondary | ICD-10-CM | POA: Diagnosis not present

## 2024-01-10 DIAGNOSIS — I25118 Atherosclerotic heart disease of native coronary artery with other forms of angina pectoris: Secondary | ICD-10-CM | POA: Diagnosis not present

## 2024-01-10 DIAGNOSIS — I119 Hypertensive heart disease without heart failure: Secondary | ICD-10-CM | POA: Diagnosis not present

## 2024-01-10 DIAGNOSIS — N1832 Chronic kidney disease, stage 3b: Secondary | ICD-10-CM | POA: Diagnosis not present

## 2024-01-12 DIAGNOSIS — N1832 Chronic kidney disease, stage 3b: Secondary | ICD-10-CM | POA: Diagnosis not present

## 2024-01-12 DIAGNOSIS — I119 Hypertensive heart disease without heart failure: Secondary | ICD-10-CM | POA: Diagnosis not present

## 2024-01-12 DIAGNOSIS — M170 Bilateral primary osteoarthritis of knee: Secondary | ICD-10-CM | POA: Diagnosis not present

## 2024-01-12 DIAGNOSIS — N1831 Chronic kidney disease, stage 3a: Secondary | ICD-10-CM | POA: Diagnosis not present

## 2024-01-15 ENCOUNTER — Encounter (HOSPITAL_COMMUNITY): Payer: Self-pay

## 2024-01-15 ENCOUNTER — Other Ambulatory Visit: Payer: Self-pay

## 2024-01-15 ENCOUNTER — Emergency Department (HOSPITAL_COMMUNITY)
Admission: EM | Admit: 2024-01-15 | Discharge: 2024-01-15 | Disposition: A | Discharge status: Home / Self Care | Attending: Emergency Medicine | Admitting: Emergency Medicine

## 2024-01-15 DIAGNOSIS — Z7902 Long term (current) use of antithrombotics/antiplatelets: Secondary | ICD-10-CM | POA: Diagnosis not present

## 2024-01-15 DIAGNOSIS — I10 Essential (primary) hypertension: Secondary | ICD-10-CM | POA: Insufficient documentation

## 2024-01-15 DIAGNOSIS — Z79899 Other long term (current) drug therapy: Secondary | ICD-10-CM | POA: Diagnosis not present

## 2024-01-15 LAB — COMPREHENSIVE METABOLIC PANEL WITH GFR
ALT: 12 U/L (ref 0–44)
AST: 14 U/L — ABNORMAL LOW (ref 15–41)
Albumin: 3.7 g/dL (ref 3.5–5.0)
Alkaline Phosphatase: 97 U/L (ref 38–126)
Anion gap: 9 (ref 5–15)
BUN: 19 mg/dL (ref 8–23)
CO2: 23 mmol/L (ref 22–32)
Calcium: 9.8 mg/dL (ref 8.9–10.3)
Chloride: 104 mmol/L (ref 98–111)
Creatinine, Ser: 1.04 mg/dL — ABNORMAL HIGH (ref 0.44–1.00)
GFR, Estimated: 53 mL/min — ABNORMAL LOW (ref >60)
Glucose, Bld: 106 mg/dL — ABNORMAL HIGH (ref 70–99)
Potassium: 3.9 mmol/L (ref 3.5–5.1)
Sodium: 136 mmol/L (ref 135–145)
Total Bilirubin: 0.8 mg/dL (ref 0.0–1.2)
Total Protein: 7.4 g/dL (ref 6.5–8.1)

## 2024-01-15 LAB — CBC WITH DIFFERENTIAL/PLATELET
Abs Immature Granulocytes: 0.03 10*3/uL (ref 0.00–0.07)
Basophils Absolute: 0.1 10*3/uL (ref 0.0–0.1)
Basophils Relative: 1 %
Eosinophils Absolute: 0.2 10*3/uL (ref 0.0–0.5)
Eosinophils Relative: 3 %
HCT: 41.9 % (ref 36.0–46.0)
Hemoglobin: 13.3 g/dL (ref 12.0–15.0)
Immature Granulocytes: 0 %
Lymphocytes Relative: 22 %
Lymphs Abs: 1.9 10*3/uL (ref 0.7–4.0)
MCH: 29.2 pg (ref 26.0–34.0)
MCHC: 31.7 g/dL (ref 30.0–36.0)
MCV: 92.1 fL (ref 80.0–100.0)
Monocytes Absolute: 1 10*3/uL (ref 0.1–1.0)
Monocytes Relative: 11 %
Neutro Abs: 5.4 10*3/uL (ref 1.7–7.7)
Neutrophils Relative %: 63 %
Platelets: 326 10*3/uL (ref 150–400)
RBC: 4.55 MIL/uL (ref 3.87–5.11)
RDW: 13.5 % (ref 11.5–15.5)
WBC: 8.6 10*3/uL (ref 4.0–10.5)
nRBC: 0 % (ref 0.0–0.2)

## 2024-01-15 LAB — TROPONIN I (HIGH SENSITIVITY): Troponin I (High Sensitivity): 7 ng/L (ref <18)

## 2024-01-15 NOTE — Discharge Instructions (Signed)
 As we discussed along with Dr. Liam Redhead, please follow up with your doctor for consideration of changes to your blood pressure medication for better control. Your labs, EKG are reassuring in the ED and you are able to be discharged safely for follow up in the office.   Return to the ED if you develop symptoms of chest pain, dizziness, shortness of breath or new concern.

## 2024-01-15 NOTE — ED Provider Triage Note (Signed)
 Emergency Medicine Provider Triage Evaluation Note  Lisa Petty , a 88 y.o. female  was evaluated in triage.  Pt complains of hypertension.  Review of Systems  Positive: No symptoms except high blood pressure readings today Negative: Headache, chest pain, SOB  Physical Exam  There were no vitals taken for this visit. Gen:   Awake, no distress   Resp:  Normal effort  MSK:   Moves extremities without difficulty  Other:    Medical Decision Making  Medically screening exam initiated at 1:45 PM.  Appropriate orders placed.  NATALIE MCEUEN was informed that the remainder of the evaluation will be completed by another provider, this initial triage assessment does not replace that evaluation, and the importance of remaining in the ED until their evaluation is complete.  Here with son who reports her blood pressure is elevated today. She states compliance with medicaitons, however, son does not feel she is being compliant.    Mandy Second, PA-C 01/15/24 1348

## 2024-01-15 NOTE — ED Triage Notes (Addendum)
 Pt presents d/t hypertension.  Pt reports she started using a home BP machine this morning which sends the readings to her PCP.  The PCP directed her to the ED d/t elevated reading this morning.    Pt reports systolic in the 200s.  Pt reports she has been taking her medications in the morning, but her visitor is unsure if she has been taking them correctly.   Pt has no complaints.

## 2024-01-15 NOTE — ED Provider Notes (Signed)
 Lisa Petty EMERGENCY DEPARTMENT AT [facility] Provider Note   CSN: [id number] Arrival date & time: 01/15/24  1324     History  Chief Complaint  Patient presents with   Hypertension    Lisa Petty is a 88 y.o. female.  Patient to ED with concern for high blood pressure. She has been sharing her blood pressure readings with PCP who advised her to come to the ED based on reading this morning over a systolic over 200. No chest pain, SOB, dizziness.   The history is provided by the patient and a relative. No language interpreter was used.  Hypertension       Home Medications Prior to Admission medications   Medication Sig Start Date End Date Taking? Authorizing Provider  amLODipine  (NORVASC ) 10 MG tablet Take 1 tablet by mouth once daily 06/21/22   Varanasi, Jayadeep S, MD  atorvastatin  (LIPITOR ) 80 MG tablet Take 80 mg by mouth at bedtime. 05/03/15   [provider]  baclofen (LIORESAL) 10 MG tablet Take 10 mg by mouth 3 (three) times daily as needed. 06/06/22   [provider]  Bempedoic Acid-Ezetimibe  (NEXLIZET ) 180-10 MG TABS TAKE 1 TABLET BY MOUTH ONCE DAILY 07/11/23   Knox Perl, MD  Calcium  Carbonate-Vitamin D  600-400 MG-UNIT tablet Take 1 tablet by mouth daily.     [provider]  clopidogrel  (PLAVIX ) 75 MG tablet Take 1 tablet by mouth once daily 06/21/22   Lucendia Rusk, MD  FARXIGA 10 MG TABS tablet Take 10 mg by mouth daily. 06/26/22   [provider]  hydrALAZINE  (APRESOLINE ) 25 MG tablet Take 25 mg by mouth 3 (three) times daily.    [provider]  hydrochlorothiazide  (HYDRODIURIL ) 25 MG tablet Take 1 tablet by mouth daily. 11/30/20   [provider]  isosorbide  mononitrate (IMDUR ) 30 MG 24 hr tablet Take 1 tablet (30 mg total) by mouth daily. 01/31/21   Walker, Caitlin S, NP  losartan  (COZAAR ) 100 MG tablet Take 100 mg by mouth daily. 04/24/17   [provider]  mirabegron  ER (MYRBETRIQ )  25 MG TB24 tablet Take 25 mg by mouth daily.    [provider]  nebivolol  (BYSTOLIC ) 2.5 MG tablet Take 2.5 mg by mouth daily.    [provider]  nitroGLYCERIN  (NITROSTAT ) 0.4 MG SL tablet Place 1 tablet (0.4 mg total) under the tongue every 5 (five) minutes as needed for chest pain. 10/10/18   Lucendia Rusk, MD  sodium chloride  (MURO 128) 5 % ophthalmic ointment Place 1 application into both eyes at bedtime.    [provider]  Sodium Chloride , Hypertonic, (MURO 128 OP) Apply 4 drops to eye 4 (four) times daily.    [provider]  tolterodine (DETROL LA) 4 MG 24 hr capsule Take 4 mg by mouth daily. 05/15/22   [provider]      Allergies    Lisinopril    Review of Systems   Review of Systems  Physical Exam Updated Vital Signs BP (!) 214/67   Pulse 62   Temp 97.7 F (36.5 C) (Oral)   Resp 18   Ht 5\' 4"  (1.626 m)   Wt 88.5 kg   SpO2 97%   BMI 33.47 kg/m  Physical Exam Vitals and nursing note reviewed.  Constitutional:      General: She is not in acute distress.    Appearance: She is well-developed. She is not ill-appearing.  Cardiovascular:     Rate and Rhythm:  Normal rate and regular rhythm.     Heart sounds: No murmur heard. Pulmonary:     Effort: Pulmonary effort is normal.     Breath sounds: No wheezing, rhonchi or rales.  Musculoskeletal:        General: Normal range of motion.     Cervical back: Normal range of motion.     Left lower leg: No edema.  Skin:    General: Skin is warm and dry.  Neurological:     Mental Status: She is alert and oriented to person, place, and time.     ED Results / Procedures / Treatments   Labs (all labs ordered are listed, but only abnormal results are displayed) Labs Reviewed  COMPREHENSIVE METABOLIC PANEL WITH GFR - Abnormal; Notable for the following components:      Result Value   Glucose, Bld 106 (*)    Creatinine, Ser 1.04 (*)    AST 14 (*)    GFR, Estimated 53 (*)     All other components within normal limits  CBC WITH DIFFERENTIAL/PLATELET  TROPONIN I (HIGH SENSITIVITY)  TROPONIN I (HIGH SENSITIVITY)    EKG EKG Interpretation Date/Time:  Tuesday January 15 2024 14:01:27 EDT Ventricular Rate:  60 PR Interval:  181 QRS Duration:  81 QT Interval:  432 QTC Calculation: 432 R Axis:   -32  Text Interpretation: Sinus rhythm Left ventricular hypertrophy Anterior infarct, old Nonspecific T abnormalities, lateral leads Confirmed by Dorenda Gandy [phone]) on 01/15/2024 5:24:15 PM  Radiology No results found.  Procedures Procedures    Medications Ordered in ED Medications - No data to display  ED Course/ Medical Decision Making/ A&P Clinical Course as of 01/16/24 2153  Tue Jan 15, 2024  1724 Here as advised by PCP for elevated blood pressure today. She reports being asymptomatic. Labs, including troponin, unremarkable in the ED. EKG nonacute. Reassuring exam, no peripheral edema. Blood pressure improved without intervention in the ED now 170/65. She is stable for discharge and f/u with PCP for consideration of medication adjustment. Patient seen and reviewed by Dr. Liam Redhead.  [SU]    Clinical Course User Index [SU] Mandy Second, PA-C                                 Medical Decision Making Amount and/or Complexity of Data Reviewed Labs: ordered.           Final Clinical Impression(s) / ED Diagnoses Final diagnoses:  Hypertension, unspecified type    Rx / DC Orders ED Discharge Orders     None         Rama Burkitt 01/16/24 2153    Dorenda Gandy, MD 01/21/24 [phone]

## 2024-02-08 DIAGNOSIS — N1832 Chronic kidney disease, stage 3b: Secondary | ICD-10-CM | POA: Diagnosis not present

## 2024-02-08 DIAGNOSIS — N1831 Chronic kidney disease, stage 3a: Secondary | ICD-10-CM | POA: Diagnosis not present

## 2024-02-08 DIAGNOSIS — I119 Hypertensive heart disease without heart failure: Secondary | ICD-10-CM | POA: Diagnosis not present

## 2024-02-08 DIAGNOSIS — I25118 Atherosclerotic heart disease of native coronary artery with other forms of angina pectoris: Secondary | ICD-10-CM | POA: Diagnosis not present

## 2024-02-11 DIAGNOSIS — N1831 Chronic kidney disease, stage 3a: Secondary | ICD-10-CM | POA: Diagnosis not present

## 2024-02-11 DIAGNOSIS — I25118 Atherosclerotic heart disease of native coronary artery with other forms of angina pectoris: Secondary | ICD-10-CM | POA: Diagnosis not present

## 2024-02-11 DIAGNOSIS — I119 Hypertensive heart disease without heart failure: Secondary | ICD-10-CM | POA: Diagnosis not present

## 2024-02-11 DIAGNOSIS — N1832 Chronic kidney disease, stage 3b: Secondary | ICD-10-CM | POA: Diagnosis not present

## 2024-02-11 DIAGNOSIS — M170 Bilateral primary osteoarthritis of knee: Secondary | ICD-10-CM | POA: Diagnosis not present

## 2024-02-12 DIAGNOSIS — G894 Chronic pain syndrome: Secondary | ICD-10-CM | POA: Diagnosis not present

## 2024-02-12 DIAGNOSIS — E785 Hyperlipidemia, unspecified: Secondary | ICD-10-CM | POA: Diagnosis not present

## 2024-02-12 DIAGNOSIS — M170 Bilateral primary osteoarthritis of knee: Secondary | ICD-10-CM | POA: Diagnosis not present

## 2024-02-12 DIAGNOSIS — I779 Disorder of arteries and arterioles, unspecified: Secondary | ICD-10-CM | POA: Diagnosis not present

## 2024-02-12 DIAGNOSIS — R54 Age-related physical debility: Secondary | ICD-10-CM | POA: Diagnosis not present

## 2024-02-12 DIAGNOSIS — F411 Generalized anxiety disorder: Secondary | ICD-10-CM | POA: Diagnosis not present

## 2024-02-12 DIAGNOSIS — I10 Essential (primary) hypertension: Secondary | ICD-10-CM | POA: Diagnosis not present

## 2024-02-12 DIAGNOSIS — Z1331 Encounter for screening for depression: Secondary | ICD-10-CM | POA: Diagnosis not present

## 2024-02-12 DIAGNOSIS — N1832 Chronic kidney disease, stage 3b: Secondary | ICD-10-CM | POA: Diagnosis not present

## 2024-02-12 DIAGNOSIS — Z0000 Encounter for general adult medical examination without abnormal findings: Secondary | ICD-10-CM | POA: Diagnosis not present

## 2024-02-12 DIAGNOSIS — E559 Vitamin D deficiency, unspecified: Secondary | ICD-10-CM | POA: Diagnosis not present

## 2024-02-12 DIAGNOSIS — E782 Mixed hyperlipidemia: Secondary | ICD-10-CM | POA: Diagnosis not present

## 2024-02-27 ENCOUNTER — Other Ambulatory Visit: Payer: Self-pay

## 2024-02-27 MED ORDER — LOSARTAN POTASSIUM 100 MG PO TABS
100.0000 mg | ORAL_TABLET | Freq: Every day | ORAL | 1 refills | Status: DC
  Filled 2024-02-28 – 2024-03-06 (×3): qty 30, 30d supply, fill #0
  Filled 2024-03-31 – 2024-04-01 (×2): qty 30, 30d supply, fill #1

## 2024-02-27 MED ORDER — CLOPIDOGREL BISULFATE 75 MG PO TABS
75.0000 mg | ORAL_TABLET | Freq: Every day | ORAL | 1 refills | Status: DC
  Filled 2024-02-28 – 2024-03-06 (×3): qty 30, 30d supply, fill #0
  Filled 2024-03-31 – 2024-04-01 (×2): qty 30, 30d supply, fill #1

## 2024-02-27 MED ORDER — ATORVASTATIN CALCIUM 80 MG PO TABS
80.0000 mg | ORAL_TABLET | Freq: Every day | ORAL | 1 refills | Status: AC
  Filled 2024-02-28 – 2024-04-21 (×4): qty 30, 30d supply, fill #0
  Filled 2024-04-22: qty 15, 15d supply, fill #0
  Filled 2024-04-22: qty 30, 30d supply, fill #0
  Filled 2024-05-01: qty 30, 30d supply, fill #1

## 2024-02-27 MED ORDER — EZETIMIBE 10 MG PO TABS
10.0000 mg | ORAL_TABLET | Freq: Every evening | ORAL | 1 refills | Status: DC
  Filled 2024-02-28 – 2024-03-06 (×3): qty 30, 30d supply, fill #0
  Filled 2024-03-31 – 2024-04-01 (×2): qty 30, 30d supply, fill #1

## 2024-02-27 MED ORDER — HYDRALAZINE HCL 25 MG PO TABS
25.0000 mg | ORAL_TABLET | Freq: Three times a day (TID) | ORAL | 1 refills | Status: DC
  Filled 2024-02-28 – 2024-03-06 (×3): qty 90, 30d supply, fill #0
  Filled 2024-03-31 – 2024-04-01 (×2): qty 90, 30d supply, fill #1

## 2024-02-27 MED ORDER — NEBIVOLOL HCL 5 MG PO TABS
5.0000 mg | ORAL_TABLET | Freq: Every day | ORAL | 1 refills | Status: DC
  Filled 2024-02-28 – 2024-03-06 (×3): qty 30, 30d supply, fill #0
  Filled 2024-03-31 – 2024-04-01 (×2): qty 30, 30d supply, fill #1

## 2024-02-27 MED ORDER — AMLODIPINE BESYLATE 10 MG PO TABS
10.0000 mg | ORAL_TABLET | Freq: Every evening | ORAL | 1 refills | Status: DC
  Filled 2024-02-28 – 2024-03-06 (×3): qty 30, 30d supply, fill #0
  Filled 2024-03-31 – 2024-04-01 (×2): qty 30, 30d supply, fill #1

## 2024-02-28 ENCOUNTER — Other Ambulatory Visit: Payer: Self-pay

## 2024-02-29 ENCOUNTER — Other Ambulatory Visit: Payer: Self-pay

## 2024-03-06 ENCOUNTER — Other Ambulatory Visit: Payer: Self-pay

## 2024-03-07 ENCOUNTER — Other Ambulatory Visit: Payer: Self-pay

## 2024-03-09 DIAGNOSIS — N1832 Chronic kidney disease, stage 3b: Secondary | ICD-10-CM | POA: Diagnosis not present

## 2024-03-09 DIAGNOSIS — N1831 Chronic kidney disease, stage 3a: Secondary | ICD-10-CM | POA: Diagnosis not present

## 2024-03-09 DIAGNOSIS — I119 Hypertensive heart disease without heart failure: Secondary | ICD-10-CM | POA: Diagnosis not present

## 2024-03-09 DIAGNOSIS — I25118 Atherosclerotic heart disease of native coronary artery with other forms of angina pectoris: Secondary | ICD-10-CM | POA: Diagnosis not present

## 2024-03-10 ENCOUNTER — Other Ambulatory Visit: Payer: Self-pay

## 2024-03-13 DIAGNOSIS — M170 Bilateral primary osteoarthritis of knee: Secondary | ICD-10-CM | POA: Diagnosis not present

## 2024-03-13 DIAGNOSIS — I119 Hypertensive heart disease without heart failure: Secondary | ICD-10-CM | POA: Diagnosis not present

## 2024-03-13 DIAGNOSIS — N1832 Chronic kidney disease, stage 3b: Secondary | ICD-10-CM | POA: Diagnosis not present

## 2024-03-13 DIAGNOSIS — I25118 Atherosclerotic heart disease of native coronary artery with other forms of angina pectoris: Secondary | ICD-10-CM | POA: Diagnosis not present

## 2024-03-13 DIAGNOSIS — N1831 Chronic kidney disease, stage 3a: Secondary | ICD-10-CM | POA: Diagnosis not present

## 2024-03-31 ENCOUNTER — Other Ambulatory Visit: Payer: Self-pay

## 2024-04-01 ENCOUNTER — Other Ambulatory Visit: Payer: Self-pay

## 2024-04-02 ENCOUNTER — Other Ambulatory Visit: Payer: Self-pay

## 2024-04-04 ENCOUNTER — Other Ambulatory Visit: Payer: Self-pay

## 2024-04-08 DIAGNOSIS — N1831 Chronic kidney disease, stage 3a: Secondary | ICD-10-CM | POA: Diagnosis not present

## 2024-04-08 DIAGNOSIS — I119 Hypertensive heart disease without heart failure: Secondary | ICD-10-CM | POA: Diagnosis not present

## 2024-04-08 DIAGNOSIS — N1832 Chronic kidney disease, stage 3b: Secondary | ICD-10-CM | POA: Diagnosis not present

## 2024-04-08 DIAGNOSIS — I25118 Atherosclerotic heart disease of native coronary artery with other forms of angina pectoris: Secondary | ICD-10-CM | POA: Diagnosis not present

## 2024-04-13 DIAGNOSIS — M170 Bilateral primary osteoarthritis of knee: Secondary | ICD-10-CM | POA: Diagnosis not present

## 2024-04-13 DIAGNOSIS — I25118 Atherosclerotic heart disease of native coronary artery with other forms of angina pectoris: Secondary | ICD-10-CM | POA: Diagnosis not present

## 2024-04-13 DIAGNOSIS — N1832 Chronic kidney disease, stage 3b: Secondary | ICD-10-CM | POA: Diagnosis not present

## 2024-04-13 DIAGNOSIS — I119 Hypertensive heart disease without heart failure: Secondary | ICD-10-CM | POA: Diagnosis not present

## 2024-04-13 DIAGNOSIS — N1831 Chronic kidney disease, stage 3a: Secondary | ICD-10-CM | POA: Diagnosis not present

## 2024-04-21 ENCOUNTER — Other Ambulatory Visit (HOSPITAL_COMMUNITY): Payer: Self-pay

## 2024-04-21 ENCOUNTER — Other Ambulatory Visit: Payer: Self-pay

## 2024-04-22 ENCOUNTER — Other Ambulatory Visit: Payer: Self-pay

## 2024-04-23 ENCOUNTER — Other Ambulatory Visit: Payer: Self-pay

## 2024-04-24 ENCOUNTER — Other Ambulatory Visit: Payer: Self-pay

## 2024-04-24 DIAGNOSIS — Z23 Encounter for immunization: Secondary | ICD-10-CM | POA: Diagnosis not present

## 2024-04-24 DIAGNOSIS — I2510 Atherosclerotic heart disease of native coronary artery without angina pectoris: Secondary | ICD-10-CM | POA: Diagnosis not present

## 2024-04-24 DIAGNOSIS — I119 Hypertensive heart disease without heart failure: Secondary | ICD-10-CM | POA: Diagnosis not present

## 2024-04-24 DIAGNOSIS — E785 Hyperlipidemia, unspecified: Secondary | ICD-10-CM | POA: Diagnosis not present

## 2024-04-24 DIAGNOSIS — F411 Generalized anxiety disorder: Secondary | ICD-10-CM | POA: Diagnosis not present

## 2024-04-24 DIAGNOSIS — R490 Dysphonia: Secondary | ICD-10-CM | POA: Diagnosis not present

## 2024-04-24 DIAGNOSIS — N1831 Chronic kidney disease, stage 3a: Secondary | ICD-10-CM | POA: Diagnosis not present

## 2024-04-24 MED ORDER — EZETIMIBE 10 MG PO TABS
10.0000 mg | ORAL_TABLET | Freq: Every evening | ORAL | 11 refills | Status: AC
  Filled 2024-05-01: qty 30, 30d supply, fill #0
  Filled 2024-05-28: qty 30, 30d supply, fill #1
  Filled 2024-06-23: qty 30, 30d supply, fill #2
  Filled 2024-07-29: qty 30, 30d supply, fill #3
  Filled 2024-08-27: qty 30, 30d supply, fill #0

## 2024-04-24 MED ORDER — ATORVASTATIN CALCIUM 80 MG PO TABS
80.0000 mg | ORAL_TABLET | Freq: Every evening | ORAL | 11 refills | Status: AC
  Filled 2024-05-28: qty 30, 30d supply, fill #0
  Filled 2024-06-23: qty 30, 30d supply, fill #1
  Filled 2024-07-29: qty 30, 30d supply, fill #2
  Filled 2024-08-27: qty 30, 30d supply, fill #0

## 2024-04-24 MED ORDER — CLOPIDOGREL BISULFATE 75 MG PO TABS
75.0000 mg | ORAL_TABLET | Freq: Every day | ORAL | 11 refills | Status: AC
  Filled 2024-05-01: qty 30, 30d supply, fill #0
  Filled 2024-05-28: qty 30, 30d supply, fill #1
  Filled 2024-06-23: qty 30, 30d supply, fill #2
  Filled 2024-07-29: qty 30, 30d supply, fill #3
  Filled 2024-08-27: qty 30, 30d supply, fill #0

## 2024-04-24 MED ORDER — HYDRALAZINE HCL 25 MG PO TABS
25.0000 mg | ORAL_TABLET | Freq: Three times a day (TID) | ORAL | 11 refills | Status: AC
  Filled 2024-05-01: qty 90, 30d supply, fill #0
  Filled 2024-05-28: qty 90, 30d supply, fill #1
  Filled 2024-06-23: qty 90, 30d supply, fill #2
  Filled 2024-07-29: qty 90, 30d supply, fill #3
  Filled 2024-08-27: qty 90, 30d supply, fill #0

## 2024-04-24 MED ORDER — AMLODIPINE BESYLATE 10 MG PO TABS
10.0000 mg | ORAL_TABLET | Freq: Every day | ORAL | 11 refills | Status: AC
  Filled 2024-05-01: qty 30, 30d supply, fill #0
  Filled 2024-05-28: qty 30, 30d supply, fill #1
  Filled 2024-06-23: qty 30, 30d supply, fill #2
  Filled 2024-07-29: qty 30, 30d supply, fill #3
  Filled 2024-08-27: qty 30, 30d supply, fill #0

## 2024-04-24 MED ORDER — NEBIVOLOL HCL 5 MG PO TABS
5.0000 mg | ORAL_TABLET | Freq: Every day | ORAL | 11 refills | Status: AC
  Filled 2024-05-01: qty 30, 30d supply, fill #0
  Filled 2024-05-28: qty 30, 30d supply, fill #1
  Filled 2024-06-23: qty 30, 30d supply, fill #2
  Filled 2024-07-29: qty 30, 30d supply, fill #3
  Filled 2024-08-27: qty 30, 30d supply, fill #0

## 2024-04-24 MED ORDER — LOSARTAN POTASSIUM 100 MG PO TABS
100.0000 mg | ORAL_TABLET | Freq: Every day | ORAL | 11 refills | Status: AC
  Filled 2024-05-01: qty 30, 30d supply, fill #0
  Filled 2024-05-28: qty 30, 30d supply, fill #1
  Filled 2024-06-23: qty 30, 30d supply, fill #2
  Filled 2024-07-29: qty 30, 30d supply, fill #3
  Filled 2024-08-27: qty 30, 30d supply, fill #0

## 2024-04-24 MED ORDER — HYDROXYZINE PAMOATE 25 MG PO CAPS
25.0000 mg | ORAL_CAPSULE | Freq: Every day | ORAL | 1 refills | Status: AC | PRN
  Filled 2024-05-01: qty 30, 30d supply, fill #0

## 2024-04-25 ENCOUNTER — Encounter (INDEPENDENT_AMBULATORY_CARE_PROVIDER_SITE_OTHER): Payer: Self-pay

## 2024-04-25 ENCOUNTER — Other Ambulatory Visit: Payer: Self-pay

## 2024-05-01 ENCOUNTER — Other Ambulatory Visit: Payer: Self-pay

## 2024-05-01 ENCOUNTER — Other Ambulatory Visit (HOSPITAL_COMMUNITY): Payer: Self-pay

## 2024-05-02 ENCOUNTER — Other Ambulatory Visit: Payer: Self-pay

## 2024-05-08 DIAGNOSIS — N1831 Chronic kidney disease, stage 3a: Secondary | ICD-10-CM | POA: Diagnosis not present

## 2024-05-08 DIAGNOSIS — N1832 Chronic kidney disease, stage 3b: Secondary | ICD-10-CM | POA: Diagnosis not present

## 2024-05-08 DIAGNOSIS — I25118 Atherosclerotic heart disease of native coronary artery with other forms of angina pectoris: Secondary | ICD-10-CM | POA: Diagnosis not present

## 2024-05-08 DIAGNOSIS — I119 Hypertensive heart disease without heart failure: Secondary | ICD-10-CM | POA: Diagnosis not present

## 2024-05-13 DIAGNOSIS — N1831 Chronic kidney disease, stage 3a: Secondary | ICD-10-CM | POA: Diagnosis not present

## 2024-05-13 DIAGNOSIS — M170 Bilateral primary osteoarthritis of knee: Secondary | ICD-10-CM | POA: Diagnosis not present

## 2024-05-13 DIAGNOSIS — N1832 Chronic kidney disease, stage 3b: Secondary | ICD-10-CM | POA: Diagnosis not present

## 2024-05-13 DIAGNOSIS — I25118 Atherosclerotic heart disease of native coronary artery with other forms of angina pectoris: Secondary | ICD-10-CM | POA: Diagnosis not present

## 2024-05-13 DIAGNOSIS — I119 Hypertensive heart disease without heart failure: Secondary | ICD-10-CM | POA: Diagnosis not present

## 2024-05-26 ENCOUNTER — Other Ambulatory Visit: Payer: Self-pay

## 2024-05-28 ENCOUNTER — Other Ambulatory Visit: Payer: Self-pay

## 2024-06-13 DIAGNOSIS — N1832 Chronic kidney disease, stage 3b: Secondary | ICD-10-CM | POA: Diagnosis not present

## 2024-06-13 DIAGNOSIS — N1831 Chronic kidney disease, stage 3a: Secondary | ICD-10-CM | POA: Diagnosis not present

## 2024-06-13 DIAGNOSIS — M170 Bilateral primary osteoarthritis of knee: Secondary | ICD-10-CM | POA: Diagnosis not present

## 2024-06-13 DIAGNOSIS — I25118 Atherosclerotic heart disease of native coronary artery with other forms of angina pectoris: Secondary | ICD-10-CM | POA: Diagnosis not present

## 2024-06-13 DIAGNOSIS — I119 Hypertensive heart disease without heart failure: Secondary | ICD-10-CM | POA: Diagnosis not present

## 2024-06-17 ENCOUNTER — Ambulatory Visit (HOSPITAL_COMMUNITY): Admission: RE | Admit: 2024-06-17 | Payer: PPO | Source: Ambulatory Visit

## 2024-06-23 ENCOUNTER — Other Ambulatory Visit: Payer: Self-pay

## 2024-06-24 ENCOUNTER — Other Ambulatory Visit: Payer: Self-pay

## 2024-06-26 ENCOUNTER — Other Ambulatory Visit: Payer: Self-pay

## 2024-06-30 ENCOUNTER — Encounter (HOSPITAL_COMMUNITY): Payer: Self-pay

## 2024-07-01 ENCOUNTER — Other Ambulatory Visit: Payer: Self-pay

## 2024-07-07 ENCOUNTER — Ambulatory Visit (HOSPITAL_COMMUNITY)
Admission: RE | Admit: 2024-07-07 | Discharge: 2024-07-07 | Disposition: A | Discharge status: Home / Self Care | Source: Ambulatory Visit | Attending: Cardiology | Admitting: Cardiology

## 2024-07-07 ENCOUNTER — Ambulatory Visit: Payer: Self-pay | Admitting: Cardiology

## 2024-07-07 DIAGNOSIS — I6523 Occlusion and stenosis of bilateral carotid arteries: Secondary | ICD-10-CM | POA: Diagnosis not present

## 2024-07-07 NOTE — Progress Notes (Signed)
 No change from 07/02/23 with occluded right carotid artery (asymptomatic) and hence no change in therapy. No further follow up needed for carotid disease

## 2024-07-08 ENCOUNTER — Other Ambulatory Visit: Payer: Self-pay

## 2024-07-09 ENCOUNTER — Other Ambulatory Visit: Payer: Self-pay

## 2024-07-11 NOTE — Progress Notes (Signed)
 Carotid artery duplex 07/07/2024: Chronic known occlusion of the right ICA.  >50% stenosis in the right ECA. 1-39% stenosis of the left ICA with heterogenous plaque. Antegrade vertebral artery flow bilaterally and normal subclavian hemodynamics. No change from prior study 07/02/2023.  No further evaluation is indicated.  Will discuss in office visit.

## 2024-07-13 DIAGNOSIS — I25118 Atherosclerotic heart disease of native coronary artery with other forms of angina pectoris: Secondary | ICD-10-CM | POA: Diagnosis not present

## 2024-07-13 DIAGNOSIS — I119 Hypertensive heart disease without heart failure: Secondary | ICD-10-CM | POA: Diagnosis not present

## 2024-07-13 DIAGNOSIS — M170 Bilateral primary osteoarthritis of knee: Secondary | ICD-10-CM | POA: Diagnosis not present

## 2024-07-13 DIAGNOSIS — N1831 Chronic kidney disease, stage 3a: Secondary | ICD-10-CM | POA: Diagnosis not present

## 2024-07-13 DIAGNOSIS — N1832 Chronic kidney disease, stage 3b: Secondary | ICD-10-CM | POA: Diagnosis not present

## 2024-07-29 ENCOUNTER — Other Ambulatory Visit: Payer: Self-pay

## 2024-08-27 ENCOUNTER — Other Ambulatory Visit (HOSPITAL_COMMUNITY): Payer: Self-pay

## 2024-08-28 ENCOUNTER — Other Ambulatory Visit: Payer: Self-pay

## 2024-08-29 ENCOUNTER — Other Ambulatory Visit (HOSPITAL_COMMUNITY): Payer: Self-pay

## 2024-08-29 ENCOUNTER — Other Ambulatory Visit: Payer: Self-pay

## 2024-08-29 MED ORDER — HYDRALAZINE HCL 25 MG PO TABS
25.0000 mg | ORAL_TABLET | Freq: Four times a day (QID) | ORAL | 3 refills | Status: AC
  Filled 2024-08-29: qty 30, 8d supply, fill #0
